# Patient Record
Sex: Male | Born: 1945 | Race: Asian | Hispanic: No | Marital: Married | Smoking: Never smoker
Health system: Southern US, Community
[De-identification: ages and names within clinical notes are randomized; demographics above are authoritative.]

## PROBLEM LIST (undated history)

## (undated) DIAGNOSIS — I2510 Atherosclerotic heart disease of native coronary artery without angina pectoris: Secondary | ICD-10-CM

## (undated) DIAGNOSIS — I219 Acute myocardial infarction, unspecified: Secondary | ICD-10-CM

## (undated) DIAGNOSIS — I4891 Unspecified atrial fibrillation: Secondary | ICD-10-CM

## (undated) DIAGNOSIS — E785 Hyperlipidemia, unspecified: Secondary | ICD-10-CM

## (undated) DIAGNOSIS — R079 Chest pain, unspecified: Secondary | ICD-10-CM

## (undated) DIAGNOSIS — F1020 Alcohol dependence, uncomplicated: Secondary | ICD-10-CM

## (undated) DIAGNOSIS — I10 Essential (primary) hypertension: Secondary | ICD-10-CM

## (undated) DIAGNOSIS — E119 Type 2 diabetes mellitus without complications: Secondary | ICD-10-CM

## (undated) HISTORY — PX: APPENDECTOMY: SHX54

---

## 2018-02-20 ENCOUNTER — Emergency Department (HOSPITAL_BASED_OUTPATIENT_CLINIC_OR_DEPARTMENT_OTHER): Payer: BLUE CROSS/BLUE SHIELD

## 2018-02-20 ENCOUNTER — Inpatient Hospital Stay (HOSPITAL_BASED_OUTPATIENT_CLINIC_OR_DEPARTMENT_OTHER)
Admission: EM | Admit: 2018-02-20 | Discharge: 2018-03-03 | DRG: 234 | Disposition: A | Discharge status: Home / Self Care | Payer: BLUE CROSS/BLUE SHIELD | Attending: Cardiothoracic Surgery | Admitting: Cardiothoracic Surgery

## 2018-02-20 ENCOUNTER — Encounter (HOSPITAL_BASED_OUTPATIENT_CLINIC_OR_DEPARTMENT_OTHER): Payer: Self-pay | Admitting: *Deleted

## 2018-02-20 ENCOUNTER — Other Ambulatory Visit: Payer: Self-pay

## 2018-02-20 DIAGNOSIS — E785 Hyperlipidemia, unspecified: Secondary | ICD-10-CM | POA: Diagnosis present

## 2018-02-20 DIAGNOSIS — E119 Type 2 diabetes mellitus without complications: Secondary | ICD-10-CM | POA: Diagnosis not present

## 2018-02-20 DIAGNOSIS — I499 Cardiac arrhythmia, unspecified: Secondary | ICD-10-CM | POA: Diagnosis not present

## 2018-02-20 DIAGNOSIS — I214 Non-ST elevation (NSTEMI) myocardial infarction: Principal | ICD-10-CM | POA: Diagnosis present

## 2018-02-20 DIAGNOSIS — I2510 Atherosclerotic heart disease of native coronary artery without angina pectoris: Secondary | ICD-10-CM | POA: Diagnosis not present

## 2018-02-20 DIAGNOSIS — I9789 Other postprocedural complications and disorders of the circulatory system, not elsewhere classified: Secondary | ICD-10-CM

## 2018-02-20 DIAGNOSIS — D62 Acute posthemorrhagic anemia: Secondary | ICD-10-CM | POA: Diagnosis not present

## 2018-02-20 DIAGNOSIS — Z23 Encounter for immunization: Secondary | ICD-10-CM | POA: Diagnosis not present

## 2018-02-20 DIAGNOSIS — Z8673 Personal history of transient ischemic attack (TIA), and cerebral infarction without residual deficits: Secondary | ICD-10-CM

## 2018-02-20 DIAGNOSIS — Z79899 Other long term (current) drug therapy: Secondary | ICD-10-CM

## 2018-02-20 DIAGNOSIS — Z951 Presence of aortocoronary bypass graft: Secondary | ICD-10-CM

## 2018-02-20 DIAGNOSIS — K5900 Constipation, unspecified: Secondary | ICD-10-CM | POA: Diagnosis not present

## 2018-02-20 DIAGNOSIS — R079 Chest pain, unspecified: Secondary | ICD-10-CM | POA: Diagnosis not present

## 2018-02-20 DIAGNOSIS — E8770 Fluid overload, unspecified: Secondary | ICD-10-CM | POA: Diagnosis not present

## 2018-02-20 DIAGNOSIS — I4891 Unspecified atrial fibrillation: Secondary | ICD-10-CM | POA: Diagnosis present

## 2018-02-20 DIAGNOSIS — Z9114 Patient's other noncompliance with medication regimen: Secondary | ICD-10-CM

## 2018-02-20 DIAGNOSIS — I252 Old myocardial infarction: Secondary | ICD-10-CM

## 2018-02-20 DIAGNOSIS — Z9119 Patient's noncompliance with other medical treatment and regimen: Secondary | ICD-10-CM

## 2018-02-20 DIAGNOSIS — R000 Tachycardia, unspecified: Secondary | ICD-10-CM | POA: Insufficient documentation

## 2018-02-20 DIAGNOSIS — Z8249 Family history of ischemic heart disease and other diseases of the circulatory system: Secondary | ICD-10-CM

## 2018-02-20 DIAGNOSIS — R072 Precordial pain: Secondary | ICD-10-CM | POA: Diagnosis not present

## 2018-02-20 DIAGNOSIS — F1020 Alcohol dependence, uncomplicated: Secondary | ICD-10-CM | POA: Diagnosis present

## 2018-02-20 DIAGNOSIS — I10 Essential (primary) hypertension: Secondary | ICD-10-CM | POA: Diagnosis present

## 2018-02-20 DIAGNOSIS — D696 Thrombocytopenia, unspecified: Secondary | ICD-10-CM | POA: Diagnosis not present

## 2018-02-20 DIAGNOSIS — Z7902 Long term (current) use of antithrombotics/antiplatelets: Secondary | ICD-10-CM

## 2018-02-20 DIAGNOSIS — E1165 Type 2 diabetes mellitus with hyperglycemia: Secondary | ICD-10-CM | POA: Diagnosis not present

## 2018-02-20 DIAGNOSIS — N179 Acute kidney failure, unspecified: Secondary | ICD-10-CM | POA: Diagnosis not present

## 2018-02-20 HISTORY — DX: Hyperlipidemia, unspecified: E78.5

## 2018-02-20 HISTORY — DX: Type 2 diabetes mellitus without complications: E11.9

## 2018-02-20 HISTORY — DX: Unspecified atrial fibrillation: I48.91

## 2018-02-20 HISTORY — DX: Atherosclerotic heart disease of native coronary artery without angina pectoris: I25.10

## 2018-02-20 HISTORY — DX: Acute myocardial infarction, unspecified: I21.9

## 2018-02-20 HISTORY — DX: Alcohol dependence, uncomplicated: F10.20

## 2018-02-20 HISTORY — DX: Essential (primary) hypertension: I10

## 2018-02-20 HISTORY — DX: Chest pain, unspecified: R07.9

## 2018-02-20 LAB — CBC WITH DIFFERENTIAL/PLATELET
Basophils Absolute: 0 10*3/uL (ref 0.0–0.1)
Basophils Relative: 0 %
EOS ABS: 0.4 10*3/uL (ref 0.0–0.7)
Eosinophils Relative: 6 %
HEMATOCRIT: 42.8 % (ref 39.0–52.0)
HEMOGLOBIN: 14.6 g/dL (ref 13.0–17.0)
LYMPHS ABS: 1.4 10*3/uL (ref 0.7–4.0)
Lymphocytes Relative: 19 %
MCH: 31.1 pg (ref 26.0–34.0)
MCHC: 34.1 g/dL (ref 30.0–36.0)
MCV: 91.3 fL (ref 78.0–100.0)
MONOS PCT: 10 %
Monocytes Absolute: 0.7 10*3/uL (ref 0.1–1.0)
NEUTROS PCT: 65 %
Neutro Abs: 4.8 10*3/uL (ref 1.7–7.7)
Platelets: 192 10*3/uL (ref 150–400)
RBC: 4.69 MIL/uL (ref 4.22–5.81)
RDW: 12.8 % (ref 11.5–15.5)
WBC: 7.4 10*3/uL (ref 4.0–10.5)

## 2018-02-20 LAB — COMPREHENSIVE METABOLIC PANEL
ALK PHOS: 69 U/L (ref 38–126)
ALT: 17 U/L (ref 0–44)
ANION GAP: 9 (ref 5–15)
AST: 22 U/L (ref 15–41)
Albumin: 4.2 g/dL (ref 3.5–5.0)
BILIRUBIN TOTAL: 1 mg/dL (ref 0.3–1.2)
BUN: 13 mg/dL (ref 8–23)
CALCIUM: 9.1 mg/dL (ref 8.9–10.3)
CO2: 28 mmol/L (ref 22–32)
CREATININE: 0.83 mg/dL (ref 0.61–1.24)
Chloride: 101 mmol/L (ref 98–111)
Glucose, Bld: 153 mg/dL — ABNORMAL HIGH (ref 70–99)
Potassium: 3.6 mmol/L (ref 3.5–5.1)
Sodium: 138 mmol/L (ref 135–145)
TOTAL PROTEIN: 7.1 g/dL (ref 6.5–8.1)

## 2018-02-20 LAB — LIPID PANEL
CHOLESTEROL: 201 mg/dL — AB (ref 0–200)
HDL: 56 mg/dL (ref >40)
LDL Cholesterol: 118 mg/dL — ABNORMAL HIGH (ref 0–99)
Total CHOL/HDL Ratio: 3.6 RATIO
Triglycerides: 135 mg/dL (ref <150)
VLDL: 27 mg/dL (ref 0–40)

## 2018-02-20 LAB — HEMOGLOBIN A1C
HEMOGLOBIN A1C: 6 % — AB (ref 4.8–5.6)
MEAN PLASMA GLUCOSE: 125.5 mg/dL

## 2018-02-20 LAB — GLUCOSE, CAPILLARY
Glucose-Capillary: 140 mg/dL — ABNORMAL HIGH (ref 70–99)
Glucose-Capillary: 191 mg/dL — ABNORMAL HIGH (ref 70–99)
Glucose-Capillary: 188 mg/dL — ABNORMAL HIGH (ref 70–99)

## 2018-02-20 LAB — TROPONIN I
TROPONIN I: 1.94 ng/mL — AB (ref <0.03)
Troponin I: <0.03 ng/mL (ref <0.03)
Troponin I: 0.21 ng/mL (ref <0.03)

## 2018-02-20 LAB — TSH: TSH: 0.964 u[IU]/mL (ref 0.350–4.500)

## 2018-02-20 MED ORDER — HEPARIN BOLUS VIA INFUSION
4000.0000 [IU] | Freq: Once | INTRAVENOUS | Status: AC
  Administered 2018-02-20: 4000 [IU] via INTRAVENOUS
  Filled 2018-02-20: qty 4000

## 2018-02-20 MED ORDER — MORPHINE SULFATE (PF) 2 MG/ML IV SOLN
2.0000 mg | INTRAVENOUS | Status: DC | PRN
  Administered 2018-02-24: 2 mg via INTRAVENOUS
  Filled 2018-02-20: qty 1

## 2018-02-20 MED ORDER — GI COCKTAIL ~~LOC~~: 30.0000 mL | Freq: Four times a day (QID) | ORAL | Status: DC | PRN

## 2018-02-20 MED ORDER — ASPIRIN EC 325 MG PO TBEC
325.0000 mg | DELAYED_RELEASE_TABLET | Freq: Every day | ORAL | Status: DC
  Administered 2018-02-21 – 2018-02-22 (×2): 325 mg via ORAL
  Filled 2018-02-20 (×2): qty 1

## 2018-02-20 MED ORDER — METOPROLOL TARTRATE 12.5 MG HALF TABLET
12.5000 mg | ORAL_TABLET | Freq: Two times a day (BID) | ORAL | Status: DC
  Administered 2018-02-20 – 2018-02-21 (×3): 12.5 mg via ORAL
  Filled 2018-02-20 (×3): qty 1

## 2018-02-20 MED ORDER — CLOPIDOGREL BISULFATE 75 MG PO TABS
75.0000 mg | ORAL_TABLET | Freq: Every day | ORAL | Status: DC
  Administered 2018-02-20 – 2018-02-23 (×4): 75 mg via ORAL
  Filled 2018-02-20 (×5): qty 1

## 2018-02-20 MED ORDER — METOPROLOL TARTRATE 5 MG/5ML IV SOLN
5.0000 mg | Freq: Once | INTRAVENOUS | Status: AC
  Administered 2018-02-20: 5 mg via INTRAVENOUS
  Filled 2018-02-20: qty 5

## 2018-02-20 MED ORDER — METOPROLOL TARTRATE 5 MG/5ML IV SOLN
2.5000 mg | Freq: Once | INTRAVENOUS | Status: AC
  Administered 2018-02-20: 2.5 mg via INTRAVENOUS
  Filled 2018-02-20: qty 5

## 2018-02-20 MED ORDER — PANTOPRAZOLE SODIUM 40 MG PO TBEC: 40.0000 mg | DELAYED_RELEASE_TABLET | Freq: Every day | ORAL | Status: DC

## 2018-02-20 MED ORDER — ENOXAPARIN SODIUM 40 MG/0.4ML ~~LOC~~ SOLN
40.0000 mg | SUBCUTANEOUS | Status: DC
  Administered 2018-02-20: 40 mg via SUBCUTANEOUS
  Filled 2018-02-20 (×2): qty 0.4

## 2018-02-20 MED ORDER — ONDANSETRON HCL 4 MG/2ML IJ SOLN: 4.0000 mg | Freq: Four times a day (QID) | INTRAMUSCULAR | Status: DC | PRN

## 2018-02-20 MED ORDER — VITAMIN B-1 100 MG PO TABS
100.0000 mg | ORAL_TABLET | Freq: Every day | ORAL | Status: DC
  Administered 2018-02-20 – 2018-03-03 (×10): 100 mg via ORAL
  Filled 2018-02-20 (×11): qty 1

## 2018-02-20 MED ORDER — NITROGLYCERIN 0.4 MG SL SUBL
0.4000 mg | SUBLINGUAL_TABLET | SUBLINGUAL | Status: DC | PRN
  Administered 2018-02-20 – 2018-02-24 (×3): 0.4 mg via SUBLINGUAL
  Filled 2018-02-20 (×3): qty 1

## 2018-02-20 MED ORDER — AMLODIPINE BESYLATE 5 MG PO TABS
5.0000 mg | ORAL_TABLET | Freq: Every day | ORAL | Status: DC
  Administered 2018-02-20 – 2018-02-25 (×6): 5 mg via ORAL
  Filled 2018-02-20 (×6): qty 1

## 2018-02-20 MED ORDER — ADULT MULTIVITAMIN W/MINERALS CH
1.0000 | ORAL_TABLET | Freq: Every day | ORAL | Status: DC
  Administered 2018-02-20 – 2018-03-03 (×11): 1 via ORAL
  Filled 2018-02-20 (×11): qty 1

## 2018-02-20 MED ORDER — ATORVASTATIN CALCIUM 20 MG PO TABS
20.0000 mg | ORAL_TABLET | Freq: Every day | ORAL | Status: DC
  Administered 2018-02-20: 20 mg via ORAL
  Filled 2018-02-20: qty 1

## 2018-02-20 MED ORDER — ACETAMINOPHEN 325 MG PO TABS
650.0000 mg | ORAL_TABLET | ORAL | Status: DC | PRN
  Administered 2018-02-22 – 2018-02-23 (×2): 650 mg via ORAL
  Filled 2018-02-20 (×2): qty 2

## 2018-02-20 MED ORDER — FOLIC ACID 1 MG PO TABS
1.0000 mg | ORAL_TABLET | Freq: Every day | ORAL | Status: DC
  Administered 2018-02-20 – 2018-03-03 (×11): 1 mg via ORAL
  Filled 2018-02-20 (×11): qty 1

## 2018-02-20 MED ORDER — ASPIRIN 81 MG PO CHEW
324.0000 mg | CHEWABLE_TABLET | Freq: Once | ORAL | Status: AC
  Administered 2018-02-20: 324 mg via ORAL
  Filled 2018-02-20: qty 4

## 2018-02-20 MED ORDER — THIAMINE HCL 100 MG/ML IJ SOLN
100.0000 mg | Freq: Every day | INTRAMUSCULAR | Status: DC
  Administered 2018-02-27: 100 mg via INTRAVENOUS
  Filled 2018-02-20: qty 2

## 2018-02-20 MED ORDER — INSULIN ASPART 100 UNIT/ML ~~LOC~~ SOLN
0.0000 [IU] | Freq: Three times a day (TID) | SUBCUTANEOUS | Status: DC
  Administered 2018-02-20 – 2018-02-21 (×2): 2 [IU] via SUBCUTANEOUS
  Administered 2018-02-21: 1 [IU] via SUBCUTANEOUS
  Administered 2018-02-21: 2 [IU] via SUBCUTANEOUS
  Administered 2018-02-22: 3 [IU] via SUBCUTANEOUS
  Administered 2018-02-22 – 2018-02-23 (×4): 1 [IU] via SUBCUTANEOUS
  Administered 2018-02-24: 3 [IU] via SUBCUTANEOUS
  Administered 2018-02-24: 1 [IU] via SUBCUTANEOUS
  Administered 2018-02-25: 2 [IU] via SUBCUTANEOUS
  Administered 2018-02-25: 1 [IU] via SUBCUTANEOUS

## 2018-02-20 MED ORDER — LORAZEPAM 2 MG/ML IJ SOLN: 1.0000 mg | Freq: Four times a day (QID) | INTRAMUSCULAR | Status: AC | PRN

## 2018-02-20 MED ORDER — INSULIN ASPART 100 UNIT/ML ~~LOC~~ SOLN: 0.0000 [IU] | Freq: Every day | SUBCUTANEOUS | Status: DC

## 2018-02-20 MED ORDER — HEPARIN (PORCINE) IN NACL 100-0.45 UNIT/ML-% IJ SOLN
1150.0000 [IU]/h | INTRAMUSCULAR | Status: DC
  Administered 2018-02-20: 1000 [IU]/h via INTRAVENOUS
  Administered 2018-02-21: 900 [IU]/h via INTRAVENOUS
  Administered 2018-02-22: 1150 [IU]/h via INTRAVENOUS
  Filled 2018-02-20 (×3): qty 250

## 2018-02-20 MED ORDER — LORAZEPAM 1 MG PO TABS: 1.0000 mg | ORAL_TABLET | Freq: Four times a day (QID) | ORAL | Status: AC | PRN

## 2018-02-20 NOTE — ED Notes (Signed)
ED Provider at bedside. 

## 2018-02-20 NOTE — ED Provider Notes (Signed)
Patient care assumed at 0700. Patient with history of coronary artery disease, hypertension, hyperlipidemia, alcohol abuse here for evaluation of episode of left sided chest pain that is resolved after nitroglycerin administration. He is noted to be in atrial fibrillation versus atrial tachycardia, no prior history of arrhythmia. Unclear how long he has been in this rhythm. His heart rates ranged from 90s to 110s during the ED stay. Metoprolol 2.5 was administered and following this is heart rate was the 70s. Recommend admission of for cardiac evaluation and further workup of possible atrial arrhythmia. Patient and family updated findings of studies recommendation for admission and they are in agreement with plan. Plan to [facility] for further evaluation. Discussed with Dr. Criselda PeachesMullen with the hospitalist service who accepts the patient and transfer.   Tilden Fossaees, Jahliyah Trice, MD 02/20/18 [phone]

## 2018-02-20 NOTE — Progress Notes (Signed)
Pt is having Cp of 3/10, Nitroglycerin given, MD notified. Will continue to monitor.

## 2018-02-20 NOTE — H&P (Signed)
History and Physical    Shane Yates ZOX:[id number] DOB: [birth year] DOA: 02/20/2018  PCP: Patient, No Pcp Per Patient coming from: home  Chief Complaint: chest pain sob  HPI: Shane Yates is a 72 y.o. male with medical history significant cad, afib on plavix, htn, diabetes, non-compliance etoh use is admitted to 3 east room 26 from [facility] [place] for chest pain rule out. [facility] are asked to admit  Information is provided by the patient through his son and interpreter as patient does not speak [place]. Son reports patient is usual state of health until early this morning he developed left anterior chest pain. He described the pain as sharp radiating to his left shoulder down his left arm. He described a "tingling" in his left hand. He denies any worsening short of breath but son reports his baseline is short of breath particularly when he sleeps. He says that he has observed patient during sleep "gasp" for air intermittently. There is been no headache dizziness syncope or near-syncope. There is been no palpitations lower extremity edema cough fever chills. No abdominal pain nausea vomiting diarrhea constipation melena bright red blood per rectum. No dysuria hematuria frequency or urgency. Patient does complain of chronic right knee pain from an old injury that has been bothering him since his long flight from [place] to here 4 months ago. Son also reports that 5 years ago patient had a similar pain he was told at that time he had heart disease and a stent was recommended but the patient declined. Son also reports that patient is basically "not taking any of the medicines the doctor's recommended". He does say that the patient consumes 150 mils of "hard liquor" daily. son reports that pain resolved shortly after nitroglycerin administered.   ED Course: in the emergency department he is afebrile hemodynamically stable and noted to be in atrial fibrillation versus atrial tachycardia. He  received 2.5 mg of metoprolol and at the time of admission heart rate is in the 80s. Repeat EKG pending.  Review of Systems: As per HPI otherwise all other systems reviewed and are negative.   Ambulatory Status: ambulates independently is independent with ADLs  Past Medical History:  Diagnosis Date  . Atrial fibrillation with RVR (HCC)   . Chest pain   . Coronary artery disease   . Diabetes mellitus without complication (HCC)   . EtOH dependence (HCC)   . Hyperlipidemia   . Hypertension   . MI (myocardial infarction) [facility])     Past Surgical History:  Procedure Laterality Date  . APPENDECTOMY      Social History   Socioeconomic History  . Marital status: Married    Spouse name: Not on file  . Number of children: Not on file  . Years of education: Not on file  . Highest education level: Not on file  Occupational History  . Not on file  Social Needs  . Financial resource strain: Not on file  . Food insecurity:    Worry: Not on file    Inability: Not on file  . Transportation needs:    Medical: Not on file    Non-medical: Not on file  Tobacco Use  . Smoking status: Never Smoker  . Smokeless tobacco: Never Used  Substance and Sexual Activity  . Alcohol use: Yes    Comment: Per son drinks nightly about of liquor, last drink last night 02/19/18  . Drug use: Never  . Sexual activity: Not on file  Lifestyle  .  Physical activity:    Days per week: Not on file    Minutes per session: Not on file  . Stress: Not on file  Relationships  . Social connections:    Talks on phone: Not on file    Gets together: Not on file    Attends religious service: Not on file    Active member of club or organization: Not on file    Attends meetings of clubs or organizations: Not on file    Relationship status: Not on file  . Intimate partner violence:    Fear of current or ex partner: Not on file    Emotionally abused: Not on file    Physically abused: Not on file    Forced  sexual activity: Not on file  Other Topics Concern  . Not on file  Social History Narrative  . Not on file    No Known Allergies  Family History  Problem Relation Age of Onset  . Hypertension Father     Prior to Admission medications   Medication Sig Start Date End Date Taking? Authorizing Provider  atorvastatin (LIPITOR) 20 MG tablet Take 20 mg by mouth every other day.   Yes [provider]  clopidogrel (PLAVIX) 75 MG tablet Take 75 mg by mouth every other day.   Yes [provider]  pantoprazole (PROTONIX) 40 MG tablet Take 40 mg by mouth every other day.   Yes [provider]  PRESCRIPTION MEDICATION Metoprolol-Amlodipine 50/5mg  1 tab every other day   Yes [provider]  PRESCRIPTION MEDICATION    Yes [provider]  sildenafil (VIAGRA) 100 MG tablet Take 100 mg by mouth as needed for erectile dysfunction.   Yes [provider]  UNABLE TO FIND Volix 0.3mg  every other day   Yes [provider]    Physical Exam: Vitals:   02/20/18 0900 02/20/18 0930 02/20/18 1048 02/20/18 1050  BP: 130/76 135/82 (!) 168/81   Pulse: 73 80 84   Resp: 15 16 18    Temp:   98.4 F (36.9 C)   TempSrc:   Oral   SpO2: 95% 98% 99%   Weight:    81.5 kg (179 lb 9.6 oz)  Height:    5\' 10"  (1.778 m)     General:  Appears calm and comfortable lying in bed with no acute distress Eyes:  PERRL, EOMI, normal lids, iris ENT:  grossly normal hearing, lips & tongue, mucous membranes of his mouth are moist and pink Neck:  no LAD, masses or thyromegaly Cardiovascular:  RRR, heart sounds are distant but I hear no m/r/g. No LE edema.  Respiratory:  Normal effort breath sounds are distant but clear here no wheezing no rhonchi no crackles Abdomen:  soft, ntnd, positive bowel sounds throughout no guarding or rebounding Skin:  no rash or induration seen on limited exam Musculoskeletal:  grossly normal tone BUE/BLE, good ROM, no bony  abnormality Psychiatric:  grossly normal mood and affect, speech fluent and appropriate, AOx3 Neurologic:  CN 2-12 grossly intact, moves all extremities in coordinated fashion, sensation intact follows commands bilateral grip 5 out of 5 lower extremity strength 5 out of 5 bilaterally  Labs on Admission: I have personally reviewed following labs and imaging studies  CBC: Recent Labs  Lab 02/20/18 0628  WBC 7.4  NEUTROABS 4.8  HGB 14.6  HCT 42.8  MCV 91.3  PLT 192   Basic Metabolic Panel: Recent Labs  Lab 02/20/18 0628  NA 138  K  3.6  CL 101  CO2 28  GLUCOSE 153*  BUN 13  CREATININE 0.83  CALCIUM 9.1   GFR: Estimated Creatinine Clearance: 84.3 mL/min (by C-G formula based on SCr of 0.83 mg/dL). Liver Function Tests: Recent Labs  Lab 02/20/18 0628  AST 22  ALT 17  ALKPHOS 69  BILITOT 1.0  PROT 7.1  ALBUMIN 4.2   No results for input(s): LIPASE, AMYLASE in the last 168 hours. No results for input(s): AMMONIA in the last 168 hours. Coagulation Profile: No results for input(s): INR, PROTIME in the last 168 hours. Cardiac Enzymes: Recent Labs  Lab 02/20/18 0628  TROPONINI <0.03   BNP (last 3 results) No results for input(s): PROBNP in the last 8760 hours. HbA1C: No results for input(s): HGBA1C in the last 72 hours. CBG: No results for input(s): GLUCAP in the last 168 hours. Lipid Profile: No results for input(s): CHOL, HDL, LDLCALC, TRIG, CHOLHDL, LDLDIRECT in the last 72 hours. Thyroid Function Tests: No results for input(s): TSH, T4TOTAL, FREET4, T3FREE, THYROIDAB in the last 72 hours. Anemia Panel: No results for input(s): VITAMINB12, FOLATE, FERRITIN, TIBC, IRON, RETICCTPCT in the last 72 hours. Urine analysis: No results found for: COLORURINE, APPEARANCEUR, LABSPEC, PHURINE, GLUCOSEU, HGBUR, BILIRUBINUR, KETONESUR, PROTEINUR, UROBILINOGEN, NITRITE, LEUKOCYTESUR  Creatinine Clearance: Estimated Creatinine Clearance: 84.3 mL/min (by C-G formula based  on SCr of 0.83 mg/dL).  Sepsis Labs: @LABRCNTIP (procalcitonin:4,lacticidven:4) )No results found for this or any previous visit (from the past 240 hour(s)).   Radiological Exams on Admission: Dg Chest 2 View  Result Date: 02/20/2018 CLINICAL DATA:  Chest pain for 2 hours, pain down RIGHT arm. EXAM: CHEST - 2 VIEW COMPARISON:  None. FINDINGS: The heart size and mediastinal contours are within normal limits. Both lungs are clear. The visualized skeletal structures are unremarkable. IMPRESSION: No active cardiopulmonary disease. Electronically Signed   By: Bary RichardStan  Maynard M.D.   On: 02/20/2018 07:26    EKG: Independently reviewed. Sinus tachycardia with irregular rate Nonspecific repol abnormality, lateral leads No previous tracing  Assessment/Plan Principal Problem:   Chest pain Active Problems:   Atrial fibrillation with RVR (HCC)   Coronary artery disease   Diabetes mellitus without complication (HCC)   Hyperlipidemia   Hypertension   EtOH dependence (HCC)   #1. Chest pain. Some typical and atypical features. History of CAD. Refused stents in the past.heart score 5. Concern for ACS. Initial troponin 0.03. Chest x-ray without acute cardiopulmonary process. Initial EKG sinus tachycardia with irregular rate nonspecific repoll abnormality. Home medications include Lipitor metoprolol. However patient is noncompliant with medications Recent was given nitroglycerin and has been pain-free ever since. -Admit to telemetry -Cycle troponin -Serial EKG -Obtain a lipid panel -continue Lipitor and metoprolol -Continue Plavix and aspirin -GI cocktail -If he rules out may be candidate for outpatient follow-up with cardiology  #2. Atrial fibrillation with RVR. home medications include Plavix and metoprolol. Son reports patient with a history of same. He also reports noncompliance with his medications. EKG with sinus tach irregular. He was given low-dose metoprolol intravenously in the emergency  department heart rate controlled ever since. chadvasc score 4. -Repeat EKG -resume home medications particularly metoprolol and Plavix -May need new prescriptions at discharge as patient's noncompliant not sure fever and brought his medications with him from home -obtain TSH  4. Hypertension.only fair control in the emergency department. Home medications include metoprolol amlodipine combination to be taken every other day. Patient noncompliant with medications. He was given low-dose metoprolol IV at [facility]. -  will start low-dose metoprolol 12.5 mg twice a day -We'll start amlodipine 5 mg daily -Monitor blood pressure closely  #5. Diabetes. No medications listed on his home med list. Serum glucose 153. -Obtain hemoglobin A1c -Sliding scale insulin for optimal control -Carb modified diet  #6. CAD/hyperlipidemia. See #1. -Home medications as noted above  #7. EtOH use. Son reports patient drinks "hard liquor" every day. Denies patient ever going through withdrawals but states "he doesn't stop drinking long enough to do so. -ciwa protocol  DVT prophylaxis:  plavix/asa Code Status: full  Family Communication: son and wife at bedside  Disposition Plan: home hopefully 24 hours  Consults called: none  Admission status: obs    Gwenyth Bender NP [facility]  If 7PM-7AM, please contact night-coverage [email or web address] Password [facility]  02/20/2018, 11:32 AM

## 2018-02-20 NOTE — ED Notes (Signed)
EDP at BS 

## 2018-02-20 NOTE — Progress Notes (Signed)
Lab called for a troponin of 0.21 NP Clydie BraunKaren notified. Will continue to monitor.

## 2018-02-20 NOTE — Consult Note (Signed)
Cardiology Consultation:   Patient ID: Shane Yates; [id number]; June 10, 1946   Admit date: 02/20/2018 Date of Consult: 02/20/2018  Primary Care Provider: Patient, No Pcp Per Primary Cardiologist: none locally   Patient Profile:   Shane Yates is a 72 y.o. male with a hx of CAD, HTN, HLD, DM, and alcohol abuse who is being seen today for the evaluation of chest pain and elevated troponin at the request of [facility].  History of Present Illness:   Shane Yates is a 72 y.o. male with a hx of CAD, HTN, HLD, DM, and alcohol abuse who is being seen today for the evaluation of chest pain and elevated troponin.  The patient is from [place] and speaks [place]. He reportedly has a history of CAD that has been treated medically. He is on ASA and clopidogrel as home medications.    He presented to the ED with today chest pain.  He described sharp pain in his left chest with radiation down his left arm. He also reported some associated dyspnea and orthopnea. He denied palpitations or LE edema or other symptoms. In the ED, ECG showed irregular tachycardia. Troponin was initially negative. He was admitted to the hospitalist team. Since admission, his troponin has risen to 1.94. He had another episode of chest pain this evening that improved with NTG. Given concern for ACS, cardiology was consulted.   History was gathered via interpreter tablet. However, the patient is a difficult historian and did not answer many questions directly. He reported that he has had some heaviness in his chest that occurs with activity such as walking and improves with rest. He denies all other symptoms. We discussed the option of cardiac catheterization, and he expressed interest in "only medicines."  Past Medical History:  Diagnosis Date  . Atrial fibrillation with RVR (HCC)   . Chest pain   . Coronary artery disease   . Diabetes mellitus without complication (HCC)   . EtOH dependence (HCC)   . Hyperlipidemia   .  Hypertension   . MI (myocardial infarction) [facility])     Past Surgical History:  Procedure Laterality Date  . APPENDECTOMY       Home Medications:  Prior to Admission medications   Medication Sig Start Date End Date Taking? Authorizing Provider  B Complex-C (B-COMPLEX WITH VITAMIN C) tablet Take 1 tablet by mouth every 3 (three) days.   Yes [provider]  PRESCRIPTION MEDICATION Take 1 tablet by mouth every 3 (three) days. Amlong MT (metoprolol succinate 47.5 mg & metoprolol tartrate 50 mg & amlodipine 5 mg) - from [place]   Yes [provider]  PRESCRIPTION MEDICATION Take 1 tablet by mouth every other day. Diapride (metformin er 500 & glimepiride 1mg ) - from [place]   Yes [provider]  PRESCRIPTION MEDICATION Take 0.3 mg by mouth every other day. Volix (Voglibose) 0.3 mg  - from [place]   Yes [provider]  PRESCRIPTION MEDICATION Take 0.25 mg by mouth daily as needed (sleep). Petril-MD (clonazepam 0.25 mg)  - from [place]   Yes [provider]  PRESCRIPTION MEDICATION Take 20 mg by mouth every 3 (three) days. Aztor 20 mg (atorvastatin) - from [place]   Yes [provider]  PRESCRIPTION MEDICATION Place 1 drop into the left eye 2 (two) times daily. Locula (sulphacetamide sodium 20%) - from [place]   Yes [provider]  PRESCRIPTION MEDICATION Take 40 mg by mouth every 3 (three) days. Pantosec (pantoprazole 40 mg) - from [place]  Yes [provider]  PRESCRIPTION MEDICATION Take 75 mg by mouth every 3 (three) days. Plagril Gold (clopidogrel 75 mg) - from [place]   Yes [provider]  PRESCRIPTION MEDICATION Take 2.6 mg by mouth every 3 (three) days. Nitrocontine (glyceryl trinitrate 2.6 mg) - from [place]   Yes [provider]  PRESCRIPTION MEDICATION Take 100 mg by mouth daily as needed (erectile dysfunction). Vigore 100 (sildenafil citrate 100 mg) - from [place]   Yes [provider]  PRESCRIPTION  MEDICATION Take 1 tablet by mouth daily as needed (pain). Disprin (acetylsalicylic acid effervescent) - from [place]   Yes [provider]    Inpatient Medications: Scheduled Meds: . amLODipine  5 mg Oral Daily  . [START ON 02/21/2018] aspirin EC  325 mg Oral Daily  . atorvastatin  20 mg Oral q1800  . clopidogrel  75 mg Oral Daily  . folic acid  1 mg Oral Daily  . insulin aspart  0-5 Units Subcutaneous QHS  . insulin aspart  0-9 Units Subcutaneous TID WC  . metoprolol tartrate  12.5 mg Oral BID  . multivitamin with minerals  1 tablet Oral Daily  . thiamine  100 mg Oral Daily   Or  . thiamine  100 mg Intravenous Daily   Continuous Infusions: . heparin 1,000 Units/hr (02/20/18 2130)   PRN Meds: acetaminophen, gi cocktail, LORazepam **OR** LORazepam, morphine injection, nitroGLYCERIN, ondansetron (ZOFRAN) IV  Allergies:   No Known Allergies  Social History:   Social History   Socioeconomic History  . Marital status: Married    Spouse name: Not on file  . Number of children: Not on file  . Years of education: Not on file  . Highest education level: Not on file  Occupational History  . Not on file  Social Needs  . Financial resource strain: Not on file  . Food insecurity:    Worry: Not on file    Inability: Not on file  . Transportation needs:    Medical: Not on file    Non-medical: Not on file  Tobacco Use  . Smoking status: Never Smoker  . Smokeless tobacco: Never Used  Substance and Sexual Activity  . Alcohol use: Yes    Comment: Per son drinks nightly about 100ml of liquor, last drink last night 02/19/18  . Drug use: Never  . Sexual activity: Not on file  Lifestyle  . Physical activity:    Days per week: Not on file    Minutes per session: Not on file  . Stress: Not on file  Relationships  . Social connections:    Talks on phone: Not on file    Gets together: Not on file    Attends religious service: Not on file    Active member of club or  organization: Not on file    Attends meetings of clubs or organizations: Not on file    Relationship status: Not on file  . Intimate partner violence:    Fear of current or ex partner: Not on file    Emotionally abused: Not on file    Physically abused: Not on file    Forced sexual activity: Not on file  Other Topics Concern  . Not on file  Social History Narrative  . Not on file    Family History:   Family History  Problem Relation Age of Onset  . Hypertension Father      ROS:  Please see the history of present illness.  All other ROS  reviewed and negative.     Physical Exam/Data:   Vitals:   02/20/18 1310 02/20/18 1634 02/20/18 1905 02/20/18 2128  BP: (!) 174/83 (!) 143/73 (!) 156/76 127/80  Pulse: 89 61 63 64  Resp: 18 18  17   Temp: 97.9 F (36.6 C) 98.2 F (36.8 C)  98.2 F (36.8 C)  TempSrc: Oral Oral  Oral  SpO2: 99% 98%  96%  Weight:      Height:        Intake/Output Summary (Last 24 hours) at 02/20/2018 2158 Last data filed at 02/20/2018 1700 Gross per 24 hour  Intake 480 ml  Output -  Net 480 ml   Filed Weights   02/20/18 0639 02/20/18 1050  Weight: 82.7 kg (182 lb 5.1 oz) 81.5 kg (179 lb 9.6 oz)   Body mass index is 25.77 kg/m.  General:  Well nourished, well developed, in no acute distress  HEENT: normal Lymph: no adenopathy Neck: no apparent JVD Cardiac:  Tachycardic and mostly regular without significant mumur Lungs:  clear to auscultation bilaterally, no wheezing, rhonchi or rales  Abd: soft, nontender, no hepatomegaly  Ext: no edema Musculoskeletal:  No deformities, BUE and BLE strength normal and equal Skin: warm and dry  Neuro:  No focal abnormalities noted Psych:  Normal affect   EKG:  The EKG was personally reviewed and demonstrates:  Irregular tachycardia with varying P-wave morphology (possibly MAT) and nonspecific ST changes Telemetry:  Telemetry was personally reviewed and demonstrates:  Tachycardia with varying P wave  morphology  Relevant CV Studies: None in system   Laboratory Data:  Chemistry Recent Labs  Lab 02/20/18 0628  NA 138  K 3.6  CL 101  CO2 28  GLUCOSE 153*  BUN 13  CREATININE 0.83  CALCIUM 9.1  GFRNONAA >60  GFRAA >60  ANIONGAP 9    Recent Labs  Lab 02/20/18 0628  PROT 7.1  ALBUMIN 4.2  AST 22  ALT 17  ALKPHOS 69  BILITOT 1.0   Hematology Recent Labs  Lab 02/20/18 0628  WBC 7.4  RBC 4.69  HGB 14.6  HCT 42.8  MCV 91.3  MCH 31.1  MCHC 34.1  RDW 12.8  PLT 192   Cardiac Enzymes Recent Labs  Lab 02/20/18 0628 02/20/18 1046 02/20/18 1615  TROPONINI <0.03 0.21* 1.94*   No results for input(s): TROPIPOC in the last 168 hours.  BNPNo results for input(s): BNP, PROBNP in the last 168 hours.  DDimer No results for input(s): DDIMER in the last 168 hours.  Radiology/Studies:  Dg Chest 2 View  Result Date: 02/20/2018 CLINICAL DATA:  Chest pain for 2 hours, pain down RIGHT arm. EXAM: CHEST - 2 VIEW COMPARISON:  None. FINDINGS: The heart size and mediastinal contours are within normal limits. Both lungs are clear. The visualized skeletal structures are unremarkable. IMPRESSION: No active cardiopulmonary disease. Electronically Signed   By: Bary Richard M.D.   On: 02/20/2018 07:26    Assessment and Plan:   NSTEMI History of CAD The patient has a history of CAD that has been treated medically. He presents with recurrent chest discomfort that occurs with exertion and now with rest. He has nonspecific changes on his ECG, and troponin is rising, consistent with probable NSTEMI. He is chest pain free at the time of my evaluation. I discussed different treatment options with the patient, and he seems disinclined to pursue invasive evaluation and wants "only medicines." Recommend ongoing medical treatment at this time with continued discussion about treatment  options when more family is present. -Continue to trend troponin  -Continue to monitor symptoms. Repeat NTG for  recurrent CP -Continue heparin gtt -Continue home ASA, clopidogrel -Continue atorvastatin  -Continue metoprolol  -Echocardiogram ordered -Lipid panel, HgA1c ordered by primary team -Will need ongoing discussion regarding treatment options.    Irregular heart rhythm  The patient was noted to have irregular tachycardia on admission and was described to have Afib. On my evaluation of his ECGs and telemetry, he appears to have an atrial complex before each QRS, although morphology is variable. Therefore query MAT or possibly blocked PACs.  -Recommend ongoing monitoring on telemetry  -Metoprolol can likely be up-titrated for better control  HTN BP elevated on admission but improved now -Continue metoprolol, amlodipine    For questions or updates, please contact [facility] Please consult [email or web address] for contact info under Cardiology/STEMI.   Signed, Ernest Mallick, MD  02/20/2018 9:58 PM

## 2018-02-20 NOTE — ED Provider Notes (Signed)
[facility] EMERGENCY DEPARTMENT Provider Note   CSN: [id number] Arrival date & time: 02/20/18  0557     History   Chief Complaint Chief Complaint  Patient presents with  . Chest Pain    HPI Shane Yates is a 72 y.o. male.  Patient presents to the emergency department for evaluation of chest pain.  Patient reports that the pain began at approximately 5 AM.  Pain radiates to the left arm and he has some tingling in the hand.  He is not short of breath.  No nausea, diaphoresis.  Patient reports that he had similar pain years ago in [place].  He was told that it was had his heart at that time and it was recommended that he have a stent placed, but he declined.  He has been medically managed since then, does not regularly have similar pain.     Past Medical History:  Diagnosis Date  . Coronary artery disease   . Diabetes mellitus without complication (HCC)   . Hyperlipidemia   . Hypertension   . MI (myocardial infarction) (HCC)     There are no active problems to display for this patient.   Past Surgical History:  Procedure Laterality Date  . APPENDECTOMY          Home Medications    Prior to Admission medications   Not on File    Family History History reviewed. No pertinent family history.  Social History Social History   Tobacco Use  . Smoking status: Never Smoker  . Smokeless tobacco: Never Used  Substance Use Topics  . Alcohol use: Yes  . Drug use: Never     Allergies   Patient has no known allergies.   Review of Systems Review of Systems  Cardiovascular: Positive for chest pain.  All other systems reviewed and are negative.    Physical Exam Updated Vital Signs BP (!) 158/98   Pulse (!) 117   Temp 98.2 F (36.8 C) (Oral)   Resp 17   Wt 82.7 kg (182 lb 5.1 oz)   SpO2 94%   Physical Exam  Constitutional: He is oriented to person, place, and time. He appears well-developed and well-nourished. No distress.  HENT:  Head:  Normocephalic and atraumatic.  Right Ear: Hearing normal.  Left Ear: Hearing normal.  Nose: Nose normal.  Mouth/Throat: Oropharynx is clear and moist and mucous membranes are normal.  Eyes: Pupils are equal, round, and reactive to light. Conjunctivae and EOM are normal.  Neck: Normal range of motion. Neck supple.  Cardiovascular: S1 normal and S2 normal. An irregular rhythm present. Exam reveals no gallop and no friction rub.  No murmur heard. Pulmonary/Chest: Effort normal and breath sounds normal. No respiratory distress. He exhibits no tenderness.  Abdominal: Soft. Normal appearance and bowel sounds are normal. There is no hepatosplenomegaly. There is no tenderness. There is no rebound, no guarding, no tenderness at McBurney's point and negative Murphy's sign. No hernia.  Musculoskeletal: Normal range of motion.  Neurological: He is alert and oriented to person, place, and time. He has normal strength. No cranial nerve deficit or sensory deficit. Coordination normal. GCS eye subscore is 4. GCS verbal subscore is 5. GCS motor subscore is 6.  Skin: Skin is warm, dry and intact. No rash noted. No cyanosis.  Psychiatric: He has a normal mood and affect. His speech is normal and behavior is normal. Thought content normal.  Nursing note and vitals reviewed.    ED Treatments / Results  Labs (all labs ordered are listed, but only abnormal results are displayed) Labs Reviewed  CBC WITH DIFFERENTIAL/PLATELET  TROPONIN I  COMPREHENSIVE METABOLIC PANEL    EKG EKG Interpretation  Date/Time:  Saturday February 20 2018 06:11:20 EDT Ventricular Rate:  103 PR Interval:    QRS Duration: 78 QT Interval:  337 QTC Calculation: 442 R Axis:   48 Text Interpretation:  Sinus tachycardia with irregular rate Nonspecific repol abnormality, lateral leads No previous tracing Confirmed by Gilda Crease [phone]) on 02/20/2018 6:14:20 AM   Radiology No results found.  Procedures Procedures  (including critical care time)  Medications Ordered in ED Medications  aspirin chewable tablet 324 mg (has no administration in time range)  nitroGLYCERIN (NITROSTAT) SL tablet 0.4 mg (has no administration in time range)     Initial Impression / Assessment and Plan / ED Course  I have reviewed the triage vital signs and the nursing notes.  Pertinent labs & imaging results that were available during my care of the patient were reviewed by me and considered in my medical decision making (see chart for details).     Patient presents with complaints of left-sided chest pain radiating to the left arm.  It sounds like he has been diagnosed with coronary artery disease previously in [place] but has not had ongoing care.  EKG shows tachycardia with atrial ectopy, nonspecific ST depressions.  Aspirin nitroglycerin ordered.  Cardiac work-up in progress. Will sign out to oncoming ER Physician to monitor and disposition.  Final Clinical Impressions(s) / ED Diagnoses   Final diagnoses:  Chest pain, unspecified type    ED Discharge Orders    None       Gilda Crease, MD 02/20/18 [phone]

## 2018-02-20 NOTE — ED Notes (Signed)
Xray here for pt, pt up to b/r prior to xray.

## 2018-02-20 NOTE — ED Triage Notes (Signed)
Here by POV with family from home for CP. Pt does not speak [place]. Family (son) translating. Wife present. C/o sharp 6/10 CP onset ~ 30 minutes PTA (0545), also HA. (Denies: back or abd pain, fever, cough, sob, NVD, dizziness or diaphoresis). H/o HTN, MI, DM, CAD.   Alert, NAD, calm, interactive, resps e/u, speaking in clear complete sentences, no dyspnea noted, skin W&D, VSS.

## 2018-02-20 NOTE — Progress Notes (Signed)
[facility]  Accept note  Patient is a 72yo man with PMH of CAD (likely), presents with chest pain and tachycardia, possibly Afib vs. Atrial tachycardia.  He had initial EKG with some ST depression, negative Troponin X 1.  Only takes his medications intermittently and is supposed to be on a beta blocker.  Currently with HR of 76, EDP Dr. Madilyn Hookees will provide with low dose beta blocker.  Otherwise stable, BP 129/85.  He will need re-initiation of medications and chest pain work up.    Debe CoderEmily Mullen, MD

## 2018-02-20 NOTE — Progress Notes (Signed)
ANTICOAGULATION CONSULT NOTE - Initial Consult  Pharmacy Consult for Heparin Indication: ACS/STEMI  No Known Allergies  Patient Measurements: Height: 5\' 10"  (177.8 cm) Weight: 179 lb 9.6 oz (81.5 kg) IBW/kg (Calculated) : 73 Heparin Dosing Weight: 81.5  Vital Signs: Temp: 98.2 F (36.8 C) (08/03 1634) Temp Source: Oral (08/03 1634) BP: 156/76 (08/03 1905) Pulse Rate: 63 (08/03 1905)  Labs: Recent Labs    02/20/18 0628 02/20/18 1046 02/20/18 1615  HGB 14.6  --   --   HCT 42.8  --   --   PLT 192  --   --   CREATININE 0.83  --   --   TROPONINI <0.03 0.21* 1.94*    Estimated Creatinine Clearance: 84.3 mL/min (by C-G formula based on SCr of 0.83 mg/dL).   Medical History: Past Medical History:  Diagnosis Date  . Atrial fibrillation with RVR (HCC)   . Chest pain   . Coronary artery disease   . Diabetes mellitus without complication (HCC)   . EtOH dependence (HCC)   . Hyperlipidemia   . Hypertension   . MI [facility]myocardial infarction) [facility](HCC)      Assessment: 72 yo male with pharmacy consult to dose heparin for ACS/STEMI.   Goal of Therapy:  Heparin level 0.3-0.7 units/ml Monitor platelets by anticoagulation protocol: Yes   Plan:  Heparin 4000 units IV x 1 Heparin drip at 1000 units/hr Heparin level in 8 hours Daily Heparin level Monitor for s/sx of bleeding  Tyheem Boughner A. Jeanella CrazePierce, PharmD, BCPS Clinical Pharmacist [place] Pager: [phone] Please utilize Amion for appropriate phone number to reach the unit pharmacist [facility])   02/20/2018,7:34 PM

## 2018-02-20 NOTE — Progress Notes (Signed)
Second troponin 0.21.  Repeat ekg NSR. Query very slight st elevation V2  pe: Gen: lying in bed in no acute distress. Denies cp Resp: no increased work of breathing CV: RRR no LE edema  Plan  1. Continue to trend troponin. If next draw elevated will consult cardiology 2. Repeat ekg in am 3. See H&P   Clydie BraunKaren m. Black, np

## 2018-02-20 NOTE — ED Notes (Signed)
[facility]

## 2018-02-20 NOTE — Progress Notes (Signed)
Lab called a critical troponin on patient, NP notified, pt has no CP. Will continue to monitor.

## 2018-02-21 ENCOUNTER — Other Ambulatory Visit: Payer: Self-pay

## 2018-02-21 ENCOUNTER — Inpatient Hospital Stay (HOSPITAL_COMMUNITY): Payer: BLUE CROSS/BLUE SHIELD

## 2018-02-21 DIAGNOSIS — K5900 Constipation, unspecified: Secondary | ICD-10-CM | POA: Diagnosis not present

## 2018-02-21 DIAGNOSIS — I4891 Unspecified atrial fibrillation: Secondary | ICD-10-CM | POA: Diagnosis not present

## 2018-02-21 DIAGNOSIS — R739 Hyperglycemia, unspecified: Secondary | ICD-10-CM

## 2018-02-21 DIAGNOSIS — N179 Acute kidney failure, unspecified: Secondary | ICD-10-CM | POA: Diagnosis not present

## 2018-02-21 DIAGNOSIS — I25118 Atherosclerotic heart disease of native coronary artery with other forms of angina pectoris: Secondary | ICD-10-CM

## 2018-02-21 DIAGNOSIS — E785 Hyperlipidemia, unspecified: Secondary | ICD-10-CM | POA: Diagnosis not present

## 2018-02-21 DIAGNOSIS — Z8673 Personal history of transient ischemic attack (TIA), and cerebral infarction without residual deficits: Secondary | ICD-10-CM | POA: Diagnosis not present

## 2018-02-21 DIAGNOSIS — Z9114 Patient's other noncompliance with medication regimen: Secondary | ICD-10-CM | POA: Diagnosis not present

## 2018-02-21 DIAGNOSIS — I361 Nonrheumatic tricuspid (valve) insufficiency: Secondary | ICD-10-CM | POA: Diagnosis not present

## 2018-02-21 DIAGNOSIS — Z01810 Encounter for preprocedural cardiovascular examination: Secondary | ICD-10-CM | POA: Diagnosis not present

## 2018-02-21 DIAGNOSIS — J90 Pleural effusion, not elsewhere classified: Secondary | ICD-10-CM | POA: Diagnosis not present

## 2018-02-21 DIAGNOSIS — I2510 Atherosclerotic heart disease of native coronary artery without angina pectoris: Secondary | ICD-10-CM | POA: Diagnosis not present

## 2018-02-21 DIAGNOSIS — R0902 Hypoxemia: Secondary | ICD-10-CM | POA: Diagnosis not present

## 2018-02-21 DIAGNOSIS — I208 Other forms of angina pectoris: Secondary | ICD-10-CM

## 2018-02-21 DIAGNOSIS — E8770 Fluid overload, unspecified: Secondary | ICD-10-CM | POA: Diagnosis not present

## 2018-02-21 DIAGNOSIS — R7303 Prediabetes: Secondary | ICD-10-CM

## 2018-02-21 DIAGNOSIS — Z9119 Patient's noncompliance with other medical treatment and regimen: Secondary | ICD-10-CM | POA: Diagnosis not present

## 2018-02-21 DIAGNOSIS — I214 Non-ST elevation (NSTEMI) myocardial infarction: Principal | ICD-10-CM

## 2018-02-21 DIAGNOSIS — R079 Chest pain, unspecified: Secondary | ICD-10-CM | POA: Diagnosis not present

## 2018-02-21 DIAGNOSIS — I10 Essential (primary) hypertension: Secondary | ICD-10-CM | POA: Diagnosis not present

## 2018-02-21 DIAGNOSIS — D696 Thrombocytopenia, unspecified: Secondary | ICD-10-CM | POA: Diagnosis not present

## 2018-02-21 DIAGNOSIS — Z951 Presence of aortocoronary bypass graft: Secondary | ICD-10-CM | POA: Diagnosis not present

## 2018-02-21 DIAGNOSIS — I25110 Atherosclerotic heart disease of native coronary artery with unstable angina pectoris: Secondary | ICD-10-CM | POA: Diagnosis not present

## 2018-02-21 DIAGNOSIS — Z23 Encounter for immunization: Secondary | ICD-10-CM | POA: Diagnosis not present

## 2018-02-21 DIAGNOSIS — E1165 Type 2 diabetes mellitus with hyperglycemia: Secondary | ICD-10-CM | POA: Diagnosis not present

## 2018-02-21 DIAGNOSIS — I252 Old myocardial infarction: Secondary | ICD-10-CM | POA: Diagnosis not present

## 2018-02-21 DIAGNOSIS — Z7902 Long term (current) use of antithrombotics/antiplatelets: Secondary | ICD-10-CM | POA: Diagnosis not present

## 2018-02-21 DIAGNOSIS — F1020 Alcohol dependence, uncomplicated: Secondary | ICD-10-CM | POA: Diagnosis not present

## 2018-02-21 DIAGNOSIS — Z8249 Family history of ischemic heart disease and other diseases of the circulatory system: Secondary | ICD-10-CM | POA: Diagnosis not present

## 2018-02-21 DIAGNOSIS — D62 Acute posthemorrhagic anemia: Secondary | ICD-10-CM | POA: Diagnosis not present

## 2018-02-21 DIAGNOSIS — E782 Mixed hyperlipidemia: Secondary | ICD-10-CM | POA: Diagnosis not present

## 2018-02-21 DIAGNOSIS — R0602 Shortness of breath: Secondary | ICD-10-CM | POA: Diagnosis not present

## 2018-02-21 DIAGNOSIS — Z79899 Other long term (current) drug therapy: Secondary | ICD-10-CM | POA: Diagnosis not present

## 2018-02-21 LAB — GLUCOSE, CAPILLARY
GLUCOSE-CAPILLARY: 141 mg/dL — AB (ref 70–99)
GLUCOSE-CAPILLARY: 190 mg/dL — AB (ref 70–99)
Glucose-Capillary: 154 mg/dL — ABNORMAL HIGH (ref 70–99)
Glucose-Capillary: 143 mg/dL — ABNORMAL HIGH (ref 70–99)

## 2018-02-21 LAB — HEPARIN LEVEL (UNFRACTIONATED)
HEPARIN UNFRACTIONATED: 0.73 [IU]/mL — AB (ref 0.30–0.70)
HEPARIN UNFRACTIONATED: 0.33 [IU]/mL (ref 0.30–0.70)
HEPARIN UNFRACTIONATED: 0.28 [IU]/mL — AB (ref 0.30–0.70)

## 2018-02-21 LAB — ECHOCARDIOGRAM COMPLETE
Height: 70 in
WEIGHTICAEL: 2800 [oz_av]

## 2018-02-21 LAB — TROPONIN I: TROPONIN I: 4.46 ng/mL — AB (ref <0.03)

## 2018-02-21 MED ORDER — METOPROLOL TARTRATE 25 MG PO TABS
25.0000 mg | ORAL_TABLET | Freq: Two times a day (BID) | ORAL | Status: DC
  Administered 2018-02-21 – 2018-02-25 (×9): 25 mg via ORAL
  Filled 2018-02-21 (×9): qty 1

## 2018-02-21 MED ORDER — PNEUMOCOCCAL VAC POLYVALENT 25 MCG/0.5ML IJ INJ
0.5000 mL | INJECTION | INTRAMUSCULAR | Status: AC
  Administered 2018-02-22: 0.5 mL via INTRAMUSCULAR
  Filled 2018-02-21 (×2): qty 0.5

## 2018-02-21 MED ORDER — ATORVASTATIN CALCIUM 40 MG PO TABS
40.0000 mg | ORAL_TABLET | Freq: Every day | ORAL | Status: DC
  Administered 2018-02-21: 40 mg via ORAL
  Filled 2018-02-21: qty 1

## 2018-02-21 MED ORDER — METOPROLOL TARTRATE 12.5 MG HALF TABLET
12.5000 mg | ORAL_TABLET | Freq: Once | ORAL | Status: AC
  Administered 2018-02-21: 12.5 mg via ORAL
  Filled 2018-02-21: qty 1

## 2018-02-21 NOTE — Progress Notes (Signed)
ANTICOAGULATION CONSULT NOTE   Pharmacy Consult for Heparin Indication: ACS/STEMI  No Known Allergies  Patient Measurements: Height: 5\' 10"  (177.8 cm) Weight: 175 lb (79.4 kg)(scale b) IBW/kg (Calculated) : 73 Heparin Dosing Weight: 81.5  Vital Signs: Temp: 98.6 F (37 C) (08/04 1313) Temp Source: Oral (08/04 1313) BP: 124/66 (08/04 1313) Pulse Rate: 64 (08/04 1313)  Labs: Recent Labs    02/20/18 0628 02/20/18 1046 02/20/18 1615 02/20/18 2230 02/21/18 0334 02/21/18 1338  HGB 14.6  --   --   --   --   --   HCT 42.8  --   --   --   --   --   PLT 192  --   --   --   --   --   HEPARINUNFRC  --   --   --   --  0.73* 0.33  CREATININE 0.83  --   --   --   --   --   TROPONINI <0.03 0.21* 1.94* 4.46*  --   --     Estimated Creatinine Clearance: 84.3 mL/min (by C-G formula based on SCr of 0.83 mg/dL).   Medical History: Past Medical History:  Diagnosis Date  . Atrial fibrillation with RVR (HCC)   . Chest pain   . Coronary artery disease   . Diabetes mellitus without complication (HCC)   . EtOH dependence (HCC)   . Hyperlipidemia   . Hypertension   . MI (myocardial infarction) [facility](HCC)      Assessment: 72 yo male with pharmacy consult to dose heparin for ACS/STEMI. CBC good, renal function good. Heparin level within goal range at 0.33.   Goal of Therapy:  Heparin level 0.3-0.7 units/ml Monitor platelets by anticoagulation protocol: Yes   Plan:  Continue heparin at 900 units/hr 2000 HL   Jeanella Caraathy Sorrel Cassetta, PharmD, [facility] Center[facility]M Clinical Pharmacist Please see AMION for all Pharmacists' Contact Phone Numbers 02/21/2018, 3:06 PM

## 2018-02-21 NOTE — Progress Notes (Signed)
Progress Note  Patient Name: Shane Yates Date of Encounter: 02/21/2018  Primary Cardiologist: No primary care provider on file.   Subjective   He has had some episodic chest heaviness but denies any currently.  He also denies palpitations and shortness of breath.  His wife and son, Shane Yates, are in the room.  His son is able to translate as the patient speaks Hindi. The patient apparently underwent coronary angiography in [place] about 5 years ago.  At that time he was told he needed stents but the patient refused.  His son says his father is quite stubborn.  His parents are also suffering from depression as their eldest son died of an accident about 12 years ago in [place].  Inpatient Medications    Scheduled Meds: . amLODipine  5 mg Oral Daily  . aspirin EC  325 mg Oral Daily  . atorvastatin  20 mg Oral q1800  . clopidogrel  75 mg Oral Daily  . folic acid  1 mg Oral Daily  . insulin aspart  0-5 Units Subcutaneous QHS  . insulin aspart  0-9 Units Subcutaneous TID WC  . metoprolol tartrate  12.5 mg Oral BID  . multivitamin with minerals  1 tablet Oral Daily  . thiamine  100 mg Oral Daily   Or  . thiamine  100 mg Intravenous Daily   Continuous Infusions: . heparin 900 Units/hr (02/21/18 0707)   PRN Meds: acetaminophen, gi cocktail, LORazepam **OR** LORazepam, morphine injection, nitroGLYCERIN, ondansetron (ZOFRAN) IV   Vital Signs    Vitals:   02/20/18 1634 02/20/18 1905 02/20/18 2128 02/21/18 0640  BP: (!) 143/73 (!) 156/76 127/80 (!) 159/99  Pulse: 61 63 64 71  Resp: 18  17 16   Temp: 98.2 F (36.8 C)  98.2 F (36.8 C) 98.2 F (36.8 C)  TempSrc: Oral  Oral Oral  SpO2: 98%  96% 95%  Weight:    175 lb (79.4 kg)  Height:        Intake/Output Summary (Last 24 hours) at 02/21/2018 1005 Last data filed at 02/21/2018 0956 Gross per 24 hour  Intake 960 ml  Output -  Net 960 ml   Filed Weights   02/20/18 0639 02/20/18 1050 02/21/18 0640  Weight: 182 lb 5.1 oz (82.7 kg) 179  lb 9.6 oz (81.5 kg) 175 lb (79.4 kg)    Telemetry    Sinus tachycardia with PACs- Personally Reviewed  ECG    Sinus tachycardia with PACs- Personally Reviewed  Physical Exam   GEN: No acute distress.   Neck: No JVD Cardiac: RRR, no murmurs, rubs, or gallops.  Respiratory: Clear to auscultation bilaterally. GI: Soft, nontender, non-distended  MS: No edema; No deformity. Neuro:  Nonfocal  Psych: Normal affect   Labs    Chemistry Recent Labs  Lab 02/20/18 0628  NA 138  K 3.6  CL 101  CO2 28  GLUCOSE 153*  BUN 13  CREATININE 0.83  CALCIUM 9.1  PROT 7.1  ALBUMIN 4.2  AST 22  ALT 17  ALKPHOS 69  BILITOT 1.0  GFRNONAA >60  GFRAA >60  ANIONGAP 9     Hematology Recent Labs  Lab 02/20/18 0628  WBC 7.4  RBC 4.69  HGB 14.6  HCT 42.8  MCV 91.3  MCH 31.1  MCHC 34.1  RDW 12.8  PLT 192    Cardiac Enzymes Recent Labs  Lab 02/20/18 0628 02/20/18 1046 02/20/18 1615 02/20/18 2230  TROPONINI <0.03 0.21* 1.94* 4.46*   No results for input(s):  TROPIPOC in the last 168 hours.   BNPNo results for input(s): BNP, PROBNP in the last 168 hours.   DDimer No results for input(s): DDIMER in the last 168 hours.   Radiology    Dg Chest 2 View  Result Date: 02/20/2018 CLINICAL DATA:  Chest pain for 2 hours, pain down RIGHT arm. EXAM: CHEST - 2 VIEW COMPARISON:  None. FINDINGS: The heart size and mediastinal contours are within normal limits. Both lungs are clear. The visualized skeletal structures are unremarkable. IMPRESSION: No active cardiopulmonary disease. Electronically Signed   By: Bary Richard M.D.   On: 02/20/2018 07:26    Cardiac Studies   Echocardiogram is pending  Patient Profile     72 y.o. male with a hx of CAD, HTN, HLD, DM, and alcohol abuse who is being seen today for the evaluation of chest pain and elevated troponin at the request of [facility].  Assessment & Plan    1.  Coronary artery disease with non-STEMI: As noted above, he  underwent coronary angiography in [place] roughly 5 years ago and was told he needed stents but refused.  Troponins are up to 4.46.  He is presently asymptomatic.  I had a long discussion with his son, Shane Sark, and encouraged him to speak to his father about undergoing coronary angiography tomorrow.  He will speak further with him.  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 For the time being, continue aspirin, Plavix, heparin, Lipitor, and metoprolol.  I will increase metoprolol to 25 mg twice daily.  I will make him n.p.o. at midnight in the event he decides to proceed with cardiac catheterization.  2.  Hypertension: Blood pressure remains elevated.  I will increase metoprolol to 25 mg twice daily.  Amlodipine may need to be increased.  This will need further monitoring.  3.  Hyperlipidemia: LDL 118.  I will increase Lipitor to 40 mg given non-STEMI.  4.  Hyperglycemia: Hemoglobin A1c is 6%.  It is in the prediabetic range.  He will need dietary modification.    For questions or updates, please contact [facility] Please consult [email or web address] for contact info under Cardiology/STEMI.      Signed, Prentice Docker, MD  02/21/2018, 10:05 AM

## 2018-02-21 NOTE — Progress Notes (Addendum)
PROGRESS NOTE  Shane Yates WUJ:[id number] DOB: [birth year] DOA: 02/20/2018 PCP: Patient, No Pcp Per  HPI/Recap of past 24 hours:  Shane Yates is a 72 y.o. male with medical history significant cad, afib on plavix, htn, diabetes, non-compliance etoh use is admitted to 3 east room [facility] for chest pain rule out. The patient apparently underwent coronary angiography in [place] about 5 years ago.  At that time he was told he needed stents but the patient refused. In the ED, ECG showed irregular tachycardia. Troponin was initially negative. He was admitted to the hospitalist team. Since admission, his troponin has risen to 4.4. He had another episode of chest pain last evening that improved with NTG.  He denies any chest pain now but stated that he has an epigastric pain when is palpated.  He has non-STEMI cardiology was consulted given concern for ACS,     Assessment/Plan: Principal Problem:   Chest pain Active Problems:   Coronary artery disease   Diabetes mellitus without complication (HCC)   Hyperlipidemia   Hypertension   Atrial fibrillation with RVR (HCC)   EtOH dependence (HCC)  NSTEMI History of CAD The patient has a history of CAD that has been treated medically. He presents with recurrent chest discomfort that occurs with exertion and now with rest. He has nonspecific changes on his ECG, and troponin is rising, consistent with probable NSTEMI.  Cardiology recommend ongoing medical treatment at this time with continued discussion about treatment options . -Continue to trend troponin  -Continue to monitor symptoms. Repeat NTG for recurrent CP -Continue heparin gtt -Continue home ASA, clopidogrel -Continue atorvastatin  -Continue metoprolol  -Echocardiogram results pending -Lipid panel, HgA1c ordered by primary team -Has not made up his mind if he wants to proceed with cardiac catheterization tomorrow but the cardiologist has made him n.p.o. in the event that he  agrees finally  Addendum 6:45 PM.:  Patient initially did not want any intervention done but he has finally agreed.  so he will have cardiac catheterization tomorrow    Irregular heart rhythm  cardiology does not think that patient has atrial fibrillation whether he thinks it is a PACs some kind of irregular tachycardia -Continue ongoing monitoring on telemetry  -Metoprolol can likely be up-titrated for better control  HTN BP elevated on admission but improved now -Continue metoprolol, amlodipine   EtOH use.  -ciwa protocol   Code Status: Full code  Family Communication: Son who was at bedside  Disposition Plan: Home possibly 1 to 2 days   Consultants:  Cardiology  Procedures:  2D echo  Antimicrobials:  None  DVT prophylaxis: Plavix aspirin   Objective: Vitals:   02/21/18 1227 02/21/18 1313  BP: (!) 144/122 124/66  Pulse: (!) 125 64  Resp:  20  Temp:  98.6 F (37 C)  SpO2:  97%    Intake/Output Summary (Last 24 hours) at 02/21/2018 1709 Last data filed at 02/21/2018 1500 Gross per 24 hour  Intake 925.72 ml  Output -  Net 925.72 ml   Filed Weights   02/20/18 0639 02/20/18 1050 02/21/18 0640  Weight: 82.7 kg (182 lb 5.1 oz) 81.5 kg (179 lb 9.6 oz) 79.4 kg (175 lb)   Body mass index is 25.11 kg/m.  Exam:     General:  Appears calm and comfortable lying in bed with no acute distress  Eyes:  PERRL, EOMI, normal lids, iris  ENT:  grossly normal hearing, lips & tongue, mucous membranes of his  mouth are moist and pink  Neck:  no LAD, masses or thyromegaly  Cardiovascular:  RRR, heart sounds are distant but I hear no m/r/g. No LE edema.   Respiratory:  Normal effort breath sounds are distant but clear here no wheezing no rhonchi no crackles  Abdomen:  soft, ntnd, positive bowel sounds throughout no guarding or rebounding  Skin:  no rash or induration seen on limited exam  Musculoskeletal:  grossly normal tone BUE/BLE, good ROM, no bony  abnormality  Psychiatric:  grossly normal mood and affect, speech fluent and appropriate, AOx3  Neurologic:  CN 2-12 grossly intact, moves all extremities in coordinated fashion, sensation intact follows commands bilateral grip 5 out of 5 lower extremity strength 5 out of 5 bilaterally     Data Reviewed: CBC: Recent Labs  Lab 02/20/18 0628  WBC 7.4  NEUTROABS 4.8  HGB 14.6  HCT 42.8  MCV 91.3  PLT 192   Basic Metabolic Panel: Recent Labs  Lab 02/20/18 0628  NA 138  K 3.6  CL 101  CO2 28  GLUCOSE 153*  BUN 13  CREATININE 0.83  CALCIUM 9.1   GFR: Estimated Creatinine Clearance: 84.3 mL/min (by C-G formula based on SCr of 0.83 mg/dL). Liver Function Tests: Recent Labs  Lab 02/20/18 0628  AST 22  ALT 17  ALKPHOS 69  BILITOT 1.0  PROT 7.1  ALBUMIN 4.2   No results for input(s): LIPASE, AMYLASE in the last 168 hours. No results for input(s): AMMONIA in the last 168 hours. Coagulation Profile: No results for input(s): INR, PROTIME in the last 168 hours. Cardiac Enzymes: Recent Labs  Lab 02/20/18 0628 02/20/18 1046 02/20/18 1615 02/20/18 2230  TROPONINI <0.03 0.21* 1.94* 4.46*   BNP (last 3 results) No results for input(s): PROBNP in the last 8760 hours. HbA1C: Recent Labs    02/20/18 1046  HGBA1C 6.0*   CBG: Recent Labs  Lab 02/20/18 1632 02/20/18 2127 02/21/18 0748 02/21/18 1129 02/21/18 1652  GLUCAP 191* 188* 141* 190* 154*   Lipid Profile: Recent Labs    02/20/18 1046  CHOL 201*  HDL 56  LDLCALC 118*  TRIG 135  CHOLHDL 3.6   Thyroid Function Tests: Recent Labs    02/20/18 1046  TSH 0.964   Anemia Panel: No results for input(s): VITAMINB12, FOLATE, FERRITIN, TIBC, IRON, RETICCTPCT in the last 72 hours. Urine analysis: No results found for: COLORURINE, APPEARANCEUR, LABSPEC, PHURINE, GLUCOSEU, HGBUR, BILIRUBINUR, KETONESUR, PROTEINUR, UROBILINOGEN, NITRITE, LEUKOCYTESUR Sepsis  Labs: @LABRCNTIP (procalcitonin:4,lacticidven:4)  )No results found for this or any previous visit (from the past 240 hour(s)).    Studies: No results found.  Scheduled Meds: . amLODipine  5 mg Oral Daily  . aspirin EC  325 mg Oral Daily  . atorvastatin  40 mg Oral q1800  . clopidogrel  75 mg Oral Daily  . folic acid  1 mg Oral Daily  . insulin aspart  0-5 Units Subcutaneous QHS  . insulin aspart  0-9 Units Subcutaneous TID WC  . metoprolol tartrate  25 mg Oral BID  . multivitamin with minerals  1 tablet Oral Daily  . [START ON 02/22/2018] pneumococcal 23 valent vaccine  0.5 mL Intramuscular Tomorrow-1000  . thiamine  100 mg Oral Daily   Or  . thiamine  100 mg Intravenous Daily    Continuous Infusions: . heparin 900 Units/hr (02/21/18 0707)     LOS: 1 day     Myrtie Neither, MD [facility]  To reach me or the doctor on  call, go to: [email or web address] Password [email or web address]  02/21/2018, 5:09 PM

## 2018-02-21 NOTE — Progress Notes (Signed)
ANTICOAGULATION CONSULT NOTE   Pharmacy Consult for Heparin Indication: ACS/STEMI  No Known Allergies  Patient Measurements: Height: 5\' 10"  (177.8 cm) Weight: 179 lb 9.6 oz (81.5 kg) IBW/kg (Calculated) : 73 Heparin Dosing Weight: 81.5  Vital Signs: Temp: 98.2 F (36.8 C) (08/03 2128) Temp Source: Oral (08/03 2128) BP: 127/80 (08/03 2128) Pulse Rate: 64 (08/03 2128)  Labs: Recent Labs    02/20/18 0628 02/20/18 1046 02/20/18 1615 02/20/18 2230 02/21/18 0334  HGB 14.6  --   --   --   --   HCT 42.8  --   --   --   --   PLT 192  --   --   --   --   HEPARINUNFRC  --   --   --   --  0.73*  CREATININE 0.83  --   --   --   --   TROPONINI <0.03 0.21* 1.94* 4.46*  --     Estimated Creatinine Clearance: 84.3 mL/min (by C-G formula based on SCr of 0.83 mg/dL).   Medical History: Past Medical History:  Diagnosis Date  . Atrial fibrillation with RVR (HCC)   . Chest pain   . Coronary artery disease   . Diabetes mellitus without complication (HCC)   . EtOH dependence (HCC)   . Hyperlipidemia   . Hypertension   . MI (myocardial infarction) [facility](HCC)      Assessment: 72 yo male with pharmacy consult to dose heparin for ACS/STEMI. CBC good, renal function good. Heparin level just above goal. Troponin is rising.   Goal of Therapy:  Heparin level 0.3-0.7 units/ml Monitor platelets by anticoagulation protocol: Yes   Plan:  Dec heparin to 900 units/hr 1400 HL  Abran DukeJames Jkayla Spiewak, PharmD, BCPS Clinical Pharmacist Phone: [phone]

## 2018-02-21 NOTE — Progress Notes (Signed)
Patient agreed to proceed with the coronary angiography tomorrow, MD and cardialogist both made aware. Will make patient NPO after midnight per MD's order.

## 2018-02-21 NOTE — Plan of Care (Signed)
  Problem: Clinical Measurements: Goal: Ability to maintain clinical measurements within normal limits will improve Outcome: Progressing   Problem: Clinical Measurements: Goal: Respiratory complications will improve Outcome: Progressing   Problem: Clinical Measurements: Goal: Cardiovascular complication will be avoided Outcome: Progressing   

## 2018-02-21 NOTE — Progress Notes (Signed)
  Echocardiogram 2D Echocardiogram has been performed.  Shane Yates T [name] 02/21/2018, 1:58 PM

## 2018-02-21 NOTE — Plan of Care (Signed)
°  Problem: Coping: °Goal: Level of anxiety will decrease °Outcome: Progressing °  °

## 2018-02-21 NOTE — Progress Notes (Signed)
ANTICOAGULATION CONSULT NOTE   Pharmacy Consult for Heparin Indication: ACS/STEMI  No Known Allergies  Patient Measurements: Height: 5\' 10"  (177.8 cm) Weight: 175 lb (79.4 kg)(scale b) IBW/kg (Calculated) : 73 Heparin Dosing Weight: 81.5  Vital Signs: Temp: 98.3 F (36.8 C) (08/04 1948) Temp Source: Oral (08/04 1948) BP: 120/86 (08/04 1948) Pulse Rate: 76 (08/04 1948)  Labs: Recent Labs    02/20/18 0628 02/20/18 1046 02/20/18 1615 02/20/18 2230 02/21/18 0334 02/21/18 1338 02/21/18 1952  HGB 14.6  --   --   --   --   --   --   HCT 42.8  --   --   --   --   --   --   PLT 192  --   --   --   --   --   --   HEPARINUNFRC  --   --   --   --  0.73* 0.33 0.28*  CREATININE 0.83  --   --   --   --   --   --   TROPONINI <0.03 0.21* 1.94* 4.46*  --   --   --     Estimated Creatinine Clearance: 84.3 mL/min (by C-G formula based on SCr of 0.83 mg/dL).   Medical History: Past Medical History:  Diagnosis Date  . Atrial fibrillation with RVR (HCC)   . Chest pain   . Coronary artery disease   . Diabetes mellitus without complication (HCC)   . EtOH dependence (HCC)   . Hyperlipidemia   . Hypertension   . MI (myocardial infarction) [facility](HCC)      Assessment: 72 yo male with pharmacy consult to dose heparin for ACS/STEMI. CBC good, renal function good. Heparin level below goal range at 0.28.   Goal of Therapy:  Heparin level 0.3-0.7 units/ml Monitor platelets by anticoagulation protocol: Yes   Plan:  Increase heparin to 1050 units/hr Heparin level in 8 hours   Corbett Moulder A. Jeanella CrazePierce, PharmD, BCPS Clinical Pharmacist [place] Pager: [phone] Please utilize Amion for appropriate phone number to reach the unit pharmacist [facility])   02/21/2018, 8:33 PM

## 2018-02-22 DIAGNOSIS — I4891 Unspecified atrial fibrillation: Secondary | ICD-10-CM

## 2018-02-22 DIAGNOSIS — E782 Mixed hyperlipidemia: Secondary | ICD-10-CM

## 2018-02-22 LAB — BASIC METABOLIC PANEL
ANION GAP: 10 (ref 5–15)
BUN: 18 mg/dL (ref 8–23)
CALCIUM: 9 mg/dL (ref 8.9–10.3)
CHLORIDE: 103 mmol/L (ref 98–111)
CO2: 26 mmol/L (ref 22–32)
Creatinine, Ser: 1.14 mg/dL (ref 0.61–1.24)
GFR calc non Af Amer: >60 mL/min (ref >60)
Glucose, Bld: 195 mg/dL — ABNORMAL HIGH (ref 70–99)
Potassium: 3.5 mmol/L (ref 3.5–5.1)
Sodium: 139 mmol/L (ref 135–145)

## 2018-02-22 LAB — CBC
HEMATOCRIT: 44 % (ref 39.0–52.0)
HEMOGLOBIN: 14.3 g/dL (ref 13.0–17.0)
MCH: 30 pg (ref 26.0–34.0)
MCHC: 32.5 g/dL (ref 30.0–36.0)
MCV: 92.4 fL (ref 78.0–100.0)
Platelets: 204 10*3/uL (ref 150–400)
RBC: 4.76 MIL/uL (ref 4.22–5.81)
RDW: 12.6 % (ref 11.5–15.5)
WBC: 8.6 10*3/uL (ref 4.0–10.5)

## 2018-02-22 LAB — GLUCOSE, CAPILLARY
GLUCOSE-CAPILLARY: 150 mg/dL — AB (ref 70–99)
GLUCOSE-CAPILLARY: 220 mg/dL — AB (ref 70–99)
GLUCOSE-CAPILLARY: 102 mg/dL — AB (ref 70–99)
Glucose-Capillary: 141 mg/dL — ABNORMAL HIGH (ref 70–99)

## 2018-02-22 LAB — HEPARIN LEVEL (UNFRACTIONATED): HEPARIN UNFRACTIONATED: 0.27 [IU]/mL — AB (ref 0.30–0.70)

## 2018-02-22 MED ORDER — SODIUM CHLORIDE 0.9 % IV SOLN
INTRAVENOUS | Status: DC
  Administered 2018-02-23: 05:00:00 via INTRAVENOUS

## 2018-02-22 MED ORDER — SODIUM CHLORIDE 0.9 % IV SOLN: 250.0000 mL | INTRAVENOUS | Status: DC | PRN

## 2018-02-22 MED ORDER — SODIUM CHLORIDE 0.9% FLUSH: 3.0000 mL | INTRAVENOUS | Status: DC | PRN

## 2018-02-22 MED ORDER — SODIUM CHLORIDE 0.9% FLUSH
3.0000 mL | Freq: Two times a day (BID) | INTRAVENOUS | Status: DC
  Administered 2018-02-23: 12:00:00 via INTRAVENOUS

## 2018-02-22 MED ORDER — ASPIRIN 81 MG PO CHEW
81.0000 mg | CHEWABLE_TABLET | ORAL | Status: AC
  Administered 2018-02-23: 81 mg via ORAL
  Filled 2018-02-22 (×2): qty 1

## 2018-02-22 MED ORDER — ROSUVASTATIN CALCIUM 10 MG PO TABS
10.0000 mg | ORAL_TABLET | Freq: Every day | ORAL | Status: DC
  Administered 2018-02-22 – 2018-02-23 (×2): 10 mg via ORAL
  Filled 2018-02-22 (×2): qty 1

## 2018-02-22 NOTE — Progress Notes (Addendum)
Progress Note  Patient Name: Shane Yates Date of Encounter: 02/22/2018  Primary Cardiologist: New to [facility]  Subjective   History obtained using bedside computer interpreter. Only speaks Hindi. Son is on the way. Denies chest pain or heaviness despite elevated enzymes.   Inpatient Medications    Scheduled Meds: . amLODipine  5 mg Oral Daily  . aspirin EC  325 mg Oral Daily  . atorvastatin  40 mg Oral q1800  . clopidogrel  75 mg Oral Daily  . folic acid  1 mg Oral Daily  . insulin aspart  0-5 Units Subcutaneous QHS  . insulin aspart  0-9 Units Subcutaneous TID WC  . metoprolol tartrate  25 mg Oral BID  . multivitamin with minerals  1 tablet Oral Daily  . pneumococcal 23 valent vaccine  0.5 mL Intramuscular Tomorrow-1000  . thiamine  100 mg Oral Daily   Or  . thiamine  100 mg Intravenous Daily   Continuous Infusions: . heparin 1,150 Units/hr (02/22/18 1027)   PRN Meds: acetaminophen, gi cocktail, LORazepam **OR** LORazepam, morphine injection, nitroGLYCERIN, ondansetron (ZOFRAN) IV   Vital Signs    Vitals:   02/21/18 1948 02/21/18 2216 02/22/18 0348 02/22/18 0837  BP: 120/86 140/84 127/64 125/63  Pulse: 76 89 76 74  Resp: 18  18   Temp: 98.3 F (36.8 C)  98.5 F (36.9 C)   TempSrc: Oral  Oral   SpO2: 99%  96%   Weight:   177 lb 8 oz (80.5 kg)   Height:        Intake/Output Summary (Last 24 hours) at 02/22/2018 1117 Last data filed at 02/22/2018 0700 Gross per 24 hour  Intake 595.17 ml  Output -  Net 595.17 ml   Filed Weights   02/20/18 1050 02/21/18 0640 02/22/18 0348  Weight: 179 lb 9.6 oz (81.5 kg) 175 lb (79.4 kg) 177 lb 8 oz (80.5 kg)   Physical Exam   General: Elderly, NAD Skin: Warm, dry, intact  Head: Normocephalic, atraumatic, clear, moist mucus membranes. Neck: Negative for carotid bruits. No JVD Lungs:Clear to ausculation bilaterally. No wheezes, rales, or rhonchi. Breathing is unlabored. Cardiovascular: RRR with S1 S2. No murmurs, rubs or  gallops Abdomen: Soft, non-tender, non-distended with normoactive bowel sounds. No hepatomegaly, No rebound/guarding. No obvious abdominal masses. MSK: Strength and tone appear normal for age. 5/5 in all extremities Extremities: No edema. No clubbing or cyanosis. DP/PT pulses 2+ bilaterally Neuro: Alert and oriented. No focal deficits. No facial asymmetry. MAE spontaneously. Psych: Responds to questions appropriately with normal affect.    Labs    Chemistry Recent Labs  Lab 02/20/18 0628  NA 138  K 3.6  CL 101  CO2 28  GLUCOSE 153*  BUN 13  CREATININE 0.83  CALCIUM 9.1  PROT 7.1  ALBUMIN 4.2  AST 22  ALT 17  ALKPHOS 69  BILITOT 1.0  GFRNONAA >60  GFRAA >60  ANIONGAP 9     Hematology Recent Labs  Lab 02/20/18 0628 02/22/18 0548  WBC 7.4 8.6  RBC 4.69 4.76  HGB 14.6 14.3  HCT 42.8 44.0  MCV 91.3 92.4  MCH 31.1 30.0  MCHC 34.1 32.5  RDW 12.8 12.6  PLT 192 204    Cardiac Enzymes Recent Labs  Lab 02/20/18 0628 02/20/18 1046 02/20/18 1615 02/20/18 2230  TROPONINI <0.03 0.21* 1.94* 4.46*   No results for input(s): TROPIPOC in the last 168 hours.   BNPNo results for input(s): BNP, PROBNP in the last 168  hours.   DDimer No results for input(s): DDIMER in the last 168 hours.   Radiology    No results found.  Telemetry    02/22/18 NSR - Personally Reviewed  ECG    02/21/18 NSR with PAC's- Personally Reviewed  Cardiac Studies   Echocardiogram 02/21/18: Study Conclusions  - Left ventricle: Due to varibility in heart rate EF difficult to   esitmated. The cavity size was normal. Systolic function was   mildly to moderately reduced. The estimated ejection fraction was   in the range of 40% to 45%. Wall motion was normal; there were no   regional wall motion abnormalities. - Aortic root: The aortic root was mildly dilated. - Right atrium: The atrium was mildly dilated.  Patient Profile     72 y.o. male with a hx of CAD, HTN, HLD, DM, and  alcohol abusewho is being seen today for the evaluation of chest pain and elevated troponinat the request of [facility].  Assessment & Plan   1. Known CAD with NSTEMI: -Hx of CAD>>>with coronary angiography approximally 5 years ago in [place] in which he was told that he needed stents however he refused per son. Presented to [facility] with episodic chest heaviness and elevated troponin.    -Troponin, <0.03, 0.21, 1.94, 4.46 -Pt will need further ischemic workup, however is very distrusting of physicians according to chart review. Weekend MD had discussion with son and encouraged father to proceed with recommendations with cath planned for today 02/22/18 -Discussed cardiac cath today via computer interpreter along with son who speaks English>>>wthey wish to further discuss with son when he arrives (30 minutes) before orders placed -Continue ASA, Plavix, BB  2. HTN: -Stable, 125/63>127/64>140/84 -Lopressor increased to 25 mg twice daily 02/21/18 -Continue to monitor   3. HLD: -LDL, 118 -Lipitor increased to 40 mg yesterday 02/21/18  4. Hyperglycemia: -Controlled, HbA1c 6 -Encouraged dietary modification and increase activity level    Signed, Georgie Chard NP-C [facility] Pager: [phone] 02/22/2018, 11:17 AM     For questions or updates, please contact   Please consult [email or web address] for contact info under Cardiology/STEMI.  Attending Note:   The patient was seen and examined.  Agree with assessment and plan as noted above.  Changes made to the above note as needed.  Patient seen and independently examined with  Georgie Chard, NP .   We discussed all aspects of the encounter. I agree with the assessment and plan as stated above.  Patient is a 72 year old gentleman with at least 5 years of chest discomfort.  He has been told in the past and needed a heart catheterization but he did not trust the doctor.  He now presents with a non-ST segment elevation myocardial  infarction. Troponin is 4.46.  I discussed with the patient through his son who acted as the interpreter.  He does agree for heart catheterization.  Unfortunately we were unable to add him on to today's schedule.  We will schedule him for tomorrow.  2.  Hyperlipidemia: His LDL is 118.  Total cholesterol is 201. He was on atorvastatin 20 mg every third day.  We may need to change to rosuvastatin.     Discussed the risks, benefits, options.  He understands and agrees to proceed.   I have spent a total of 40 minutes with patient reviewing hospital  notes , telemetry, EKGs, labs and examining patient as well as establishing an assessment and plan that was discussed with the patient. > 50% of time was spent  in direct patient care.   Vesta MixerPhilip J. Kymberly Blomberg, Montez HagemanJr., MD, [facility] HospitalFACC 02/22/2018, 1:01 PM [address],  [address] Office [phone] Pager [phone]

## 2018-02-22 NOTE — Progress Notes (Signed)
ANTICOAGULATION CONSULT NOTE - Follow Up Consult  Pharmacy Consult for Heparin Indication: chest pain/ACS  No Known Allergies  Patient Measurements: Height: 5\' 10"  (177.8 cm) Weight: 177 lb 8 oz (80.5 kg) IBW/kg (Calculated) : 73 Heparin Dosing Weight:  80.5 kg  Vital Signs: Temp: 98.5 F (36.9 C) (08/05 0348) Temp Source: Oral (08/05 0348) BP: 125/63 (08/05 0837) Pulse Rate: 74 (08/05 0837)  Labs: Recent Labs    02/20/18 0628 02/20/18 1046 02/20/18 1615 02/20/18 2230  02/21/18 1338 02/21/18 1952 02/22/18 0548  HGB 14.6  --   --   --   --   --   --  14.3  HCT 42.8  --   --   --   --   --   --  44.0  PLT 192  --   --   --   --   --   --  204  HEPARINUNFRC  --   --   --   --    < > 0.33 0.28* 0.27*  CREATININE 0.83  --   --   --   --   --   --   --   TROPONINI <0.03 0.21* 1.94* 4.46*  --   --   --   --    < > = values in this interval not displayed.    Estimated Creatinine Clearance: 84.3 mL/min (by C-G formula based on SCr of 0.83 mg/dL).  Assessment:  Anticoag: NSTEMI. H/o afib unclear. HL 0.27.CBC WNL  Goal of Therapy:  Heparin level 0.3-0.7 units/ml Monitor platelets by anticoagulation protocol: Yes   Plan:  Increase IV heparin to 1150 units/hr Daily HL, CBC Agrees to cath but does not want any intervention  Kaley Jutras S. Merilynn Finlandobertson, PharmD, [facility] LLCBCPS Clinical Staff Pharmacist Pager [phone]  Misty Stanleyobertson, Ariyel Jeangilles Stillinger 02/22/2018,10:14 AM

## 2018-02-22 NOTE — Progress Notes (Signed)
PROGRESS NOTE  Shane Sailsehal Jakes  ZOX:[id number] DOB: 11/27/1945 DOA: 02/20/2018 PCP: Patient, No Pcp Per   Brief Narrative: Shane Yates is a 72 y.o. male with a history of CAD, AFib on plavix, HTN, T2DM, alcohol use who [facility]. Troponins have risen and [facility] though the patient has initially declined catheterization.   Assessment & Plan: NSTEMI with history of CAD: Reportedly was told he needed stenting but refused at that time. - Continue metoprolol, ASA, plavix, statin, prn NTG and oxygen.  - Catheterization per cardiology.   HTN: Well controlled HLD: Augmented statin Alcohol use: No evidence of withdrawal >48 hours into hospitalization.  Pre-diabetes: HbA1c 6.0%. Recommend outpatient follow up. At that time could consider metformin, though wouldn't start while inpatient due to need for contrast and general distrust of providers with whom he has no rapport yet.   Disposition Plan: Home pending further work up. I've spoken with Dr. Elease HashimotoNahser who agrees that cardiology can assume primary care for the patient at this time. Please call the hospitalists if any assistance is needed.  Consultants:   Cardiology to become primary 8/5.   Subjective: Chest discomfort continues, but improved. No dyspnea, diaphoresis, abd pain.   Objective: BP 116/76 (BP Location: Left Arm)   Pulse 70   Temp 98 F (36.7 C) (Oral)   Resp 18   Ht 5\' 10"  (1.778 m)   Wt 80.5 kg (177 lb 8 oz)   SpO2 96%   BMI 25.47 kg/m    Gen: 72 y.o. male in no distress  Pulm: Non-labored breathing . Clear to auscultation bilaterally.  CV: Regular rate and rhythm. No murmur, rub, or gallop. No JVD, no pedal edema. GI: Abdomen soft, non-tender, non-distended, with normoactive bowel sounds. No organomegaly or masses felt. Ext: Warm, no deformities Skin: No rashes, lesions no ulcers Neuro: Alert and oriented. No focal neurological  deficits. Psych: Judgement and insight appear normal. Mood & affect appropriate.   Data Reviewed: I have personally reviewed following labs and imaging studies  CBC: Recent Labs  Lab 02/20/18 0628 02/22/18 0548  WBC 7.4 8.6  NEUTROABS 4.8  --   HGB 14.6 14.3  HCT 42.8 44.0  MCV 91.3 92.4  PLT 192 204   Basic Metabolic Panel: Recent Labs  Lab 02/20/18 0628  NA 138  K 3.6  CL 101  CO2 28  GLUCOSE 153*  BUN 13  CREATININE 0.83  CALCIUM 9.1   GFR: Estimated Creatinine Clearance: 84.3 mL/min (by C-G formula based on SCr of 0.83 mg/dL). Liver Function Tests: Recent Labs  Lab 02/20/18 0628  AST 22  ALT 17  ALKPHOS 69  BILITOT 1.0  PROT 7.1  ALBUMIN 4.2   No results for input(s): LIPASE, AMYLASE in the last 168 hours. No results for input(s): AMMONIA in the last 168 hours. Coagulation Profile: No results for input(s): INR, PROTIME in the last 168 hours. Cardiac Enzymes: Recent Labs  Lab 02/20/18 0628 02/20/18 1046 02/20/18 1615 02/20/18 2230  TROPONINI <0.03 0.21* 1.94* 4.46*   BNP (last 3 results) No results for input(s): PROBNP in the last 8760 hours. HbA1C: Recent Labs    02/20/18 1046  HGBA1C 6.0*   CBG: Recent Labs  Lab 02/21/18 1129 02/21/18 1652 02/21/18 2114 02/22/18 0733 02/22/18 1235  GLUCAP 190* 154* 143* 141* 150*   Lipid Profile: Recent Labs    02/20/18 1046  CHOL 201*  HDL 56  LDLCALC 118*  TRIG 135  CHOLHDL 3.6   Thyroid Function Tests: Recent Labs    02/20/18 1046  TSH 0.964   Anemia Panel: No results for input(s): VITAMINB12, FOLATE, FERRITIN, TIBC, IRON, RETICCTPCT in the last 72 hours. Urine analysis: No results found for: COLORURINE, APPEARANCEUR, LABSPEC, PHURINE, GLUCOSEU, HGBUR, BILIRUBINUR, KETONESUR, PROTEINUR, UROBILINOGEN, NITRITE, LEUKOCYTESUR No results found for this or any previous visit (from the past 240 hour(s)).    Radiology Studies: No results found.  Scheduled Meds: . amLODipine  5 mg Oral  Daily  . aspirin EC  325 mg Oral Daily  . clopidogrel  75 mg Oral Daily  . folic acid  1 mg Oral Daily  . insulin aspart  0-5 Units Subcutaneous QHS  . insulin aspart  0-9 Units Subcutaneous TID WC  . metoprolol tartrate  25 mg Oral BID  . multivitamin with minerals  1 tablet Oral Daily  . rosuvastatin  10 mg Oral q1800  . thiamine  100 mg Oral Daily   Or  . thiamine  100 mg Intravenous Daily   Continuous Infusions: . heparin 1,150 Units/hr (02/22/18 1027)     LOS: 2 days   Time spent: 25 minutes.  Tyrone Nine, MD [facility] [email or web address] Password [email or web address] 02/22/2018, 2:28 PM

## 2018-02-23 ENCOUNTER — Ambulatory Visit (HOSPITAL_COMMUNITY)
Admission: RE | Admit: 2018-02-23 | Payer: BLUE CROSS/BLUE SHIELD | Source: Ambulatory Visit | Admitting: Interventional Cardiology

## 2018-02-23 ENCOUNTER — Encounter (HOSPITAL_COMMUNITY)
Admission: EM | Disposition: A | Discharge status: Home / Self Care | Payer: Self-pay | Source: Home / Self Care | Attending: Cardiothoracic Surgery

## 2018-02-23 DIAGNOSIS — I214 Non-ST elevation (NSTEMI) myocardial infarction: Secondary | ICD-10-CM

## 2018-02-23 HISTORY — PX: LEFT HEART CATH AND CORONARY ANGIOGRAPHY: CATH118249

## 2018-02-23 LAB — GLUCOSE, CAPILLARY
GLUCOSE-CAPILLARY: 122 mg/dL — AB (ref 70–99)
GLUCOSE-CAPILLARY: 100 mg/dL — AB (ref 70–99)
Glucose-Capillary: 129 mg/dL — ABNORMAL HIGH (ref 70–99)
Glucose-Capillary: 179 mg/dL — ABNORMAL HIGH (ref 70–99)

## 2018-02-23 LAB — BASIC METABOLIC PANEL
Anion gap: 12 (ref 5–15)
BUN: 24 mg/dL — AB (ref 8–23)
CHLORIDE: 101 mmol/L (ref 98–111)
CO2: 27 mmol/L (ref 22–32)
CREATININE: 1.19 mg/dL (ref 0.61–1.24)
Calcium: 9.2 mg/dL (ref 8.9–10.3)
GFR calc Af Amer: >60 mL/min (ref >60)
GFR calc non Af Amer: 60 mL/min — ABNORMAL LOW (ref >60)
Glucose, Bld: 142 mg/dL — ABNORMAL HIGH (ref 70–99)
POTASSIUM: 3.6 mmol/L (ref 3.5–5.1)
SODIUM: 140 mmol/L (ref 135–145)

## 2018-02-23 LAB — CBC
HEMATOCRIT: 43.4 % (ref 39.0–52.0)
HEMOGLOBIN: 13.8 g/dL (ref 13.0–17.0)
MCH: 30.2 pg (ref 26.0–34.0)
MCHC: 31.8 g/dL (ref 30.0–36.0)
MCV: 95 fL (ref 78.0–100.0)
Platelets: 202 10*3/uL (ref 150–400)
RBC: 4.57 MIL/uL (ref 4.22–5.81)
RDW: 12.6 % (ref 11.5–15.5)
WBC: 7.5 10*3/uL (ref 4.0–10.5)

## 2018-02-23 LAB — HEPARIN LEVEL (UNFRACTIONATED): Heparin Unfractionated: 0.55 IU/mL (ref 0.30–0.70)

## 2018-02-23 LAB — SURGICAL PCR SCREEN
MRSA, PCR: NEGATIVE
Staphylococcus aureus: NEGATIVE

## 2018-02-23 SURGERY — LEFT HEART CATH AND CORONARY ANGIOGRAPHY
Anesthesia: LOCAL

## 2018-02-23 MED ORDER — FENTANYL CITRATE (PF) 100 MCG/2ML IJ SOLN
INTRAMUSCULAR | Status: DC | PRN
  Administered 2018-02-23: 25 ug via INTRAVENOUS

## 2018-02-23 MED ORDER — SODIUM CHLORIDE 0.9 % IV SOLN
INTRAVENOUS | Status: AC
  Administered 2018-02-23 (×2): via INTRAVENOUS

## 2018-02-23 MED ORDER — IOHEXOL 350 MG/ML SOLN
INTRAVENOUS | Status: DC | PRN
  Administered 2018-02-23: 95 mL

## 2018-02-23 MED ORDER — MIDAZOLAM HCL 2 MG/2ML IJ SOLN
INTRAMUSCULAR | Status: DC | PRN
  Administered 2018-02-23: 2 mg via INTRAVENOUS

## 2018-02-23 MED ORDER — SODIUM CHLORIDE 0.9% FLUSH
3.0000 mL | Freq: Two times a day (BID) | INTRAVENOUS | Status: DC
  Administered 2018-02-23 – 2018-02-25 (×5): 3 mL via INTRAVENOUS

## 2018-02-23 MED ORDER — SODIUM CHLORIDE 0.9% FLUSH: 3.0000 mL | INTRAVENOUS | Status: DC | PRN

## 2018-02-23 MED ORDER — HEPARIN SODIUM (PORCINE) 1000 UNIT/ML IJ SOLN
INTRAMUSCULAR | Status: DC | PRN
  Administered 2018-02-23: 4000 [IU] via INTRAVENOUS

## 2018-02-23 MED ORDER — LIDOCAINE HCL (PF) 1 % IJ SOLN
INTRAMUSCULAR | Status: DC | PRN
  Administered 2018-02-23: 2 mL

## 2018-02-23 MED ORDER — HEPARIN (PORCINE) IN NACL 1000-0.9 UT/500ML-% IV SOLN
INTRAVENOUS | Status: DC | PRN
  Administered 2018-02-23 (×2): 500 mL

## 2018-02-23 MED ORDER — MUPIROCIN 2 % EX OINT
1.0000 "application " | TOPICAL_OINTMENT | Freq: Two times a day (BID) | CUTANEOUS | Status: AC
  Administered 2018-02-23 – 2018-02-24 (×3): 1 via NASAL
  Filled 2018-02-23: qty 22

## 2018-02-23 MED ORDER — ASPIRIN 81 MG PO CHEW
81.0000 mg | CHEWABLE_TABLET | Freq: Every day | ORAL | Status: DC
  Administered 2018-02-24 – 2018-02-25 (×2): 81 mg via ORAL
  Filled 2018-02-23 (×2): qty 1

## 2018-02-23 MED ORDER — ACETAMINOPHEN 325 MG PO TABS
650.0000 mg | ORAL_TABLET | ORAL | Status: DC | PRN
  Administered 2018-02-24 – 2018-02-25 (×3): 650 mg via ORAL
  Filled 2018-02-23 (×3): qty 2

## 2018-02-23 MED ORDER — SODIUM CHLORIDE 0.9 % IV SOLN: 250.0000 mL | INTRAVENOUS | Status: DC | PRN

## 2018-02-23 MED ORDER — ONDANSETRON HCL 4 MG/2ML IJ SOLN: 4.0000 mg | Freq: Four times a day (QID) | INTRAMUSCULAR | Status: DC | PRN

## 2018-02-23 MED ORDER — HEPARIN (PORCINE) IN NACL 100-0.45 UNIT/ML-% IJ SOLN
1200.0000 [IU]/h | INTRAMUSCULAR | Status: DC
  Administered 2018-02-23: 1150 [IU]/h via INTRAVENOUS
  Administered 2018-02-24 – 2018-02-25 (×2): 1200 [IU]/h via INTRAVENOUS
  Filled 2018-02-23 (×3): qty 250

## 2018-02-23 MED ORDER — VERAPAMIL HCL 2.5 MG/ML IV SOLN
INTRAVENOUS | Status: DC | PRN
  Administered 2018-02-23: 10 mL via INTRA_ARTERIAL

## 2018-02-23 SURGICAL SUPPLY — 9 items
CATH 5FR JL3.5 JR4 ANG PIG MP (CATHETERS) ×2 IMPLANT
DEVICE RAD COMP TR BAND LRG (VASCULAR PRODUCTS) ×2 IMPLANT
GLIDESHEATH SLEND SS 6F .021 (SHEATH) ×2 IMPLANT
GUIDEWIRE INQWIRE 1.5J.035X260 (WIRE) ×1 IMPLANT
INQWIRE 1.5J .035X260CM (WIRE) ×2
KIT HEART LEFT (KITS) ×2 IMPLANT
PACK CARDIAC CATHETERIZATION (CUSTOM PROCEDURE TRAY) ×2 IMPLANT
TRANSDUCER W/STOPCOCK (MISCELLANEOUS) ×2 IMPLANT
TUBING CIL FLEX 10 FLL-RA (TUBING) ×2 IMPLANT

## 2018-02-23 NOTE — Progress Notes (Signed)
I am covering cardmaster role, Dr. Eldridge DaceVaranasi requests CVTS consult -Relayed to CVTS nurse coordinator to help facilitate consult. Trexton Escamilla PA-C

## 2018-02-23 NOTE — Progress Notes (Signed)
ANTICOAGULATION CONSULT NOTE - Follow Up Consult  Pharmacy Consult for Heparin Indication: NSTEMI  No Known Allergies  Patient Measurements: Height: 5\' 10"  (177.8 cm) Weight: 177 lb 8 oz (80.5 kg) IBW/kg (Calculated) : 73 Heparin Dosing Weight:  80.5 kg  Vital Signs: Temp: 97.7 F (36.5 C) (08/06 0416) Temp Source: Oral (08/06 0416) BP: 114/59 (08/06 0832) Pulse Rate: 70 (08/06 0832)  Labs: Recent Labs    02/20/18 1046 02/20/18 1615 02/20/18 2230  02/21/18 1952 02/22/18 0548 02/22/18 1441 02/23/18 0620  HGB  --   --   --   --   --  14.3  --  13.8  HCT  --   --   --   --   --  44.0  --  43.4  PLT  --   --   --   --   --  204  --  202  HEPARINUNFRC  --   --   --    < > 0.28* 0.27*  --  0.55  CREATININE  --   --   --   --   --   --  1.14 1.19  TROPONINI 0.21* 1.94* 4.46*  --   --   --   --   --    < > = values in this interval not displayed.    Estimated Creatinine Clearance: 58.8 mL/min (by C-G formula based on SCr of 1.19 mg/dL).  Assessment:  Anticoag: NSTEMI. H/o afib unclear. HL 0.55. CBC still WNL  Goal of Therapy:  Heparin level 0.3-0.7 units/ml Monitor platelets by anticoagulation protocol: Yes   Plan:  Continue IV heparin at 1150 units/hr Daily HL, CBC Agrees to cath but does not want any intervention Cath 8/6   Shane Yates Shane Yates, Shane Yates, Shane Yates Clinical Staff Pharmacist Pager [phone]  Shane Yates, Shane Yates 02/23/2018,8:42 AM

## 2018-02-23 NOTE — Progress Notes (Deleted)
CRITICAL VALUE ALERT  Critical Value:  Trop  Date & Time Notified:  02/23/2018 @ 1830  Provider Notified: NP Jeraldine LootsHammond  Orders Received/Actions taken: No further orders at this time. Continue with scheduled trop draws to determine peak.

## 2018-02-23 NOTE — Interval H&P Note (Signed)
Cath Lab Visit (complete for each Cath Lab visit)  Clinical Evaluation Leading to the Procedure:   ACS: Yes.    Non-ACS:    Anginal Classification: CCS IV  Anti-ischemic medical therapy: Minimal Therapy (1 class of medications)  Non-Invasive Test Results: No non-invasive testing performed  Prior CABG: No previous CABG      History and Physical Interval Note:  02/23/2018 1:38 PM  Shane Yates  has presented today for surgery, with the diagnosis of cp  The various methods of treatment have been discussed with the patient and family. After consideration of risks, benefits and other options for treatment, the patient has consented to  Procedure(s): LEFT HEART CATH AND CORONARY ANGIOGRAPHY (N/A) as a surgical intervention .  The patient's history has been reviewed, patient examined, no change in status, stable for surgery.  I have reviewed the patient's chart and labs.  Questions were answered to the patient's satisfaction.     Lance MussJayadeep Latayvia Mandujano

## 2018-02-23 NOTE — Progress Notes (Signed)
Progress Note  Patient Name: Shane Yates Date of Encounter: 02/23/2018  Primary Cardiologist:  New to [facility]   Subjective   72 year old gentleman from [place].  He speaks primarily Hindi.  We have communicated to him via his son who acts as interpreter and also the interpreter software program.  Patient has had episodes of chest pain for several years.  He was told that he needed a cath while in [place] several years ago.  He refused cath at that time.  He now presents with a non-ST segment elevation myocardial infarction.  Troponin levels are 4.46.  He is scheduled for heart catheterization today.  Inpatient Medications    Scheduled Meds: . amLODipine  5 mg Oral Daily  . aspirin EC  325 mg Oral Daily  . clopidogrel  75 mg Oral Daily  . folic acid  1 mg Oral Daily  . insulin aspart  0-5 Units Subcutaneous QHS  . insulin aspart  0-9 Units Subcutaneous TID WC  . metoprolol tartrate  25 mg Oral BID  . multivitamin with minerals  1 tablet Oral Daily  . rosuvastatin  10 mg Oral q1800  . sodium chloride flush  3 mL Intravenous Q12H  . thiamine  100 mg Oral Daily   Or  . thiamine  100 mg Intravenous Daily   Continuous Infusions: . sodium chloride    . sodium chloride 50 mL/hr at 02/23/18 0456  . heparin 1,150 Units/hr (02/22/18 1940)   PRN Meds: sodium chloride, acetaminophen, gi cocktail, LORazepam **OR** LORazepam, morphine injection, nitroGLYCERIN, ondansetron (ZOFRAN) IV, sodium chloride flush   Vital Signs    Vitals:   02/22/18 1241 02/22/18 2037 02/23/18 0416 02/23/18 0832  BP: 116/76 125/68 106/66 (!) 114/59  Pulse: 70 78 66 70  Resp:  18 18   Temp: 98 F (36.7 C) 98.1 F (36.7 C) 97.7 F (36.5 C)   TempSrc: Oral Oral Oral   SpO2: 96% 97% 96% 99%  Weight:   177 lb 8 oz (80.5 kg)   Height:        Intake/Output Summary (Last 24 hours) at 02/23/2018 0854 Last data filed at 02/23/2018 0520 Gross per 24 hour  Intake 268.66 ml  Output -  Net 268.66 ml   Filed Weights    02/21/18 0640 02/22/18 0348 02/23/18 0416  Weight: 175 lb (79.4 kg) 177 lb 8 oz (80.5 kg) 177 lb 8 oz (80.5 kg)    Telemetry    NSR  - Personally Reviewed  ECG     NSR  - Personally Reviewed  Physical Exam   GEN:  Elderly gentleman, no acute distress. Neck: No JVD Cardiac: RRR, no murmurs, rubs, or gallops.  Respiratory: Clear to auscultation bilaterally. GI: Soft, nontender, non-distended  MS: No edema; No deformity.  Radial pulses strong.  Neuro:  Nonfocal  Psych: Normal affect   Labs    Chemistry Recent Labs  Lab 02/20/18 0628 02/22/18 1441 02/23/18 0620  NA 138 139 140  K 3.6 3.5 3.6  CL 101 103 101  CO2 28 26 27   GLUCOSE 153* 195* 142*  BUN 13 18 24*  CREATININE 0.83 1.14 1.19  CALCIUM 9.1 9.0 9.2  PROT 7.1  --   --   ALBUMIN 4.2  --   --   AST 22  --   --   ALT 17  --   --   ALKPHOS 69  --   --   BILITOT 1.0  --   --  GFRNONAA >60 >60 60*  GFRAA >60 >60 >60  ANIONGAP 9 10 12      Hematology Recent Labs  Lab 02/20/18 0628 02/22/18 0548 02/23/18 0620  WBC 7.4 8.6 7.5  RBC 4.69 4.76 4.57  HGB 14.6 14.3 13.8  HCT 42.8 44.0 43.4  MCV 91.3 92.4 95.0  MCH 31.1 30.0 30.2  MCHC 34.1 32.5 31.8  RDW 12.8 12.6 12.6  PLT 192 204 202    Cardiac Enzymes Recent Labs  Lab 02/20/18 0628 02/20/18 1046 02/20/18 1615 02/20/18 2230  TROPONINI <0.03 0.21* 1.94* 4.46*   No results for input(s): TROPIPOC in the last 168 hours.   BNPNo results for input(s): BNP, PROBNP in the last 168 hours.   DDimer No results for input(s): DDIMER in the last 168 hours.   Radiology    No results found.  Cardiac Studies     Patient Profile     72 y.o. male admitted with non-ST segment elevation myocardial infarction.  He is currently pain-free.  He is scheduled for heart catheterization later today.  Assessment & Plan    1.  Coronary artery disease: The patient will have a heart catheter station today.  I discussed the options of stent placement versus a  coronary artery bypass surgery versus medical therapy.  I told the patient and the son with that we will know more after the heart  Our PA Vin Bhagat has offered to assist in talking to the patient after the cath to help explain the findings.   IN addition,  I anticipate having the patinet follow up with vin and me when he comes to the office.   2.  Hyperlipidemia:   We change the atorvastatin to rosuvastatin 10 mg a day.    For questions or updates, please contact [facility] Please consult [email or web address] for contact info under Cardiology/STEMI.      Signed, Kristeen Miss, MD  02/23/2018, 8:54 AM

## 2018-02-23 NOTE — H&P (View-Only) (Signed)
 Progress Note  Patient Name: Shane Yates Date of Encounter: 02/23/2018  Primary Cardiologist:  New to [facility]   Subjective   72-year-old gentleman from [place].  He speaks primarily Hindi.  We have communicated to him via his son who acts as interpreter and also the interpreter software program.  Patient has had episodes of chest pain for several years.  He was told that he needed a cath while in [place] several years ago.  He refused cath at that time.  He now presents with a non-ST segment elevation myocardial infarction.  Troponin levels are 4.46.  He is scheduled for heart catheterization today.  Inpatient Medications    Scheduled Meds: . amLODipine  5 mg Oral Daily  . aspirin EC  325 mg Oral Daily  . clopidogrel  75 mg Oral Daily  . folic acid  1 mg Oral Daily  . insulin aspart  0-5 Units Subcutaneous QHS  . insulin aspart  0-9 Units Subcutaneous TID WC  . metoprolol tartrate  25 mg Oral BID  . multivitamin with minerals  1 tablet Oral Daily  . rosuvastatin  10 mg Oral q1800  . sodium chloride flush  3 mL Intravenous Q12H  . thiamine  100 mg Oral Daily   Or  . thiamine  100 mg Intravenous Daily   Continuous Infusions: . sodium chloride    . sodium chloride 50 mL/hr at 02/23/18 0456  . heparin 1,150 Units/hr (02/22/18 1940)   PRN Meds: sodium chloride, acetaminophen, gi cocktail, LORazepam **OR** LORazepam, morphine injection, nitroGLYCERIN, ondansetron (ZOFRAN) IV, sodium chloride flush   Vital Signs    Vitals:   02/22/18 1241 02/22/18 2037 02/23/18 0416 02/23/18 0832  BP: 116/76 125/68 106/66 (!) 114/59  Pulse: 70 78 66 70  Resp:  18 18   Temp: 98 F (36.7 C) 98.1 F (36.7 C) 97.7 F (36.5 C)   TempSrc: Oral Oral Oral   SpO2: 96% 97% 96% 99%  Weight:   177 lb 8 oz (80.5 kg)   Height:        Intake/Output Summary (Last 24 hours) at 02/23/2018 0854 Last data filed at 02/23/2018 0520 Gross per 24 hour  Intake 268.66 ml  Output -  Net 268.66 ml   Filed Weights    02/21/18 0640 02/22/18 0348 02/23/18 0416  Weight: 175 lb (79.4 kg) 177 lb 8 oz (80.5 kg) 177 lb 8 oz (80.5 kg)    Telemetry    NSR  - Personally Reviewed  ECG     NSR  - Personally Reviewed  Physical Exam   GEN:  Elderly gentleman, no acute distress. Neck: No JVD Cardiac: RRR, no murmurs, rubs, or gallops.  Respiratory: Clear to auscultation bilaterally. GI: Soft, nontender, non-distended  MS: No edema; No deformity.  Radial pulses strong.  Neuro:  Nonfocal  Psych: Normal affect   Labs    Chemistry Recent Labs  Lab 02/20/18 0628 02/22/18 1441 02/23/18 0620  NA 138 139 140  K 3.6 3.5 3.6  CL 101 103 101  CO2 28 26 27  GLUCOSE 153* 195* 142*  BUN 13 18 24*  CREATININE 0.83 1.14 1.19  CALCIUM 9.1 9.0 9.2  PROT 7.1  --   --   ALBUMIN 4.2  --   --   AST 22  --   --   ALT 17  --   --   ALKPHOS 69  --   --   BILITOT 1.0  --   --     GFRNONAA >60 >60 60*  GFRAA >60 >60 >60  ANIONGAP 9 10 12     Hematology Recent Labs  Lab 02/20/18 0628 02/22/18 0548 02/23/18 0620  WBC 7.4 8.6 7.5  RBC 4.69 4.76 4.57  HGB 14.6 14.3 13.8  HCT 42.8 44.0 43.4  MCV 91.3 92.4 95.0  MCH 31.1 30.0 30.2  MCHC 34.1 32.5 31.8  RDW 12.8 12.6 12.6  PLT 192 204 202    Cardiac Enzymes Recent Labs  Lab 02/20/18 0628 02/20/18 1046 02/20/18 1615 02/20/18 2230  TROPONINI <0.03 0.21* 1.94* 4.46*   No results for input(s): TROPIPOC in the last 168 hours.   BNPNo results for input(s): BNP, PROBNP in the last 168 hours.   DDimer No results for input(s): DDIMER in the last 168 hours.   Radiology    No results found.  Cardiac Studies     Patient Profile     72 y.o. male admitted with non-ST segment elevation myocardial infarction.  He is currently pain-free.  He is scheduled for heart catheterization later today.  Assessment & Plan    1.  Coronary artery disease: The patient will have a heart catheter station today.  I discussed the options of stent placement versus a  coronary artery bypass surgery versus medical therapy.  I told the patient and the son with that we will know more after the heart  Our PA Vin Bhagat has offered to assist in talking to the patient after the cath to help explain the findings.   IN addition,  I anticipate having the patinet follow up with vin and me when he comes to the office.   2.  Hyperlipidemia:   We change the atorvastatin to rosuvastatin 10 mg a day.    For questions or updates, please contact [facility] Please consult [email or web address] for contact info under Cardiology/STEMI.      Signed, Aryaa Bunting, MD  02/23/2018, 8:54 AM    

## 2018-02-23 NOTE — Progress Notes (Signed)
ANTICOAGULATION CONSULT NOTE - Follow Up Consult  Pharmacy Consult for Heparin Indication: CAD pending TCTS eval  No Known Allergies  Patient Measurements: Height: 5\' 10"  (177.8 cm) Weight: 179 lb 10.8 oz (81.5 kg) IBW/kg (Calculated) : 73 Heparin Dosing Weight: 81.5 kg  Vital Signs: Temp: 98.2 F (36.8 C) (08/06 1434) Temp Source: Oral (08/06 1434) BP: 129/83 (08/06 1515) Pulse Rate: 63 (08/06 1515)  Labs: Recent Labs    02/20/18 2230  02/21/18 1952 02/22/18 0548 02/22/18 1441 02/23/18 0620  HGB  --   --   --  14.3  --  13.8  HCT  --   --   --  44.0  --  43.4  PLT  --   --   --  204  --  202  HEPARINUNFRC  --    < > 0.28* 0.27*  --  0.55  CREATININE  --   --   --   --  1.14 1.19  TROPONINI 4.46*  --   --   --   --   --    < > = values in this interval not displayed.    Estimated Creatinine Clearance: 58.8 mL/min (by C-G formula based on SCr of 1.19 mg/dL).   Medications:  Scheduled:  . amLODipine  5 mg Oral Daily  . [START ON 02/24/2018] aspirin  81 mg Oral Daily  . folic acid  1 mg Oral Daily  . insulin aspart  0-5 Units Subcutaneous QHS  . insulin aspart  0-9 Units Subcutaneous TID WC  . metoprolol tartrate  25 mg Oral BID  . multivitamin with minerals  1 tablet Oral Daily  . mupirocin ointment  1 application Nasal BID  . rosuvastatin  10 mg Oral q1800  . sodium chloride flush  3 mL Intravenous Q12H  . thiamine  100 mg Oral Daily   Or  . thiamine  100 mg Intravenous Daily   Infusions:  . sodium chloride 100 mL/hr at 02/23/18 1439  . sodium chloride    . heparin 1,150 Units/hr (02/22/18 1940)    Assessment: 72 yo M s/p cath to restart on heparin for multivessel CAD pending TCTS eval for bypass surgery.  Heparin was previously therapeutic at 1150 units/hr.  Per MD orders, to restart heparin 8 hours after sheath pull which was at 1410 today.  TR band placed.  Goal of Therapy:  Heparin level 0.3-0.7 units/ml Monitor platelets by anticoagulation  protocol: Yes   Plan:  At 2200, restart heparin at 1150 units/hr. Next heparin level with AM labs  Toys 'R' UsKimberly Kebrina Friend, Pharm.D., BCPS Clinical Pharmacist Pager: [phone] Clinical phone for 02/23/2018 is [phone].  **Pharmacist phone directory can now be found on [email or web address] ([email or web address]).  [facility] under [facility].  02/23/2018 4:30 PM

## 2018-02-24 ENCOUNTER — Encounter (HOSPITAL_COMMUNITY): Payer: Self-pay | Admitting: Interventional Cardiology

## 2018-02-24 ENCOUNTER — Other Ambulatory Visit: Payer: Self-pay | Admitting: *Deleted

## 2018-02-24 ENCOUNTER — Encounter (HOSPITAL_COMMUNITY): Payer: BLUE CROSS/BLUE SHIELD

## 2018-02-24 ENCOUNTER — Inpatient Hospital Stay (HOSPITAL_COMMUNITY): Payer: BLUE CROSS/BLUE SHIELD

## 2018-02-24 DIAGNOSIS — Z01810 Encounter for preprocedural cardiovascular examination: Secondary | ICD-10-CM

## 2018-02-24 DIAGNOSIS — I10 Essential (primary) hypertension: Secondary | ICD-10-CM

## 2018-02-24 DIAGNOSIS — I2510 Atherosclerotic heart disease of native coronary artery without angina pectoris: Secondary | ICD-10-CM

## 2018-02-24 DIAGNOSIS — I25110 Atherosclerotic heart disease of native coronary artery with unstable angina pectoris: Secondary | ICD-10-CM

## 2018-02-24 DIAGNOSIS — I214 Non-ST elevation (NSTEMI) myocardial infarction: Secondary | ICD-10-CM

## 2018-02-24 LAB — CBC
HCT: 39.6 % (ref 39.0–52.0)
Hemoglobin: 12.7 g/dL — ABNORMAL LOW (ref 13.0–17.0)
MCH: 30.2 pg (ref 26.0–34.0)
MCHC: 32.1 g/dL (ref 30.0–36.0)
MCV: 94.3 fL (ref 78.0–100.0)
Platelets: 188 10*3/uL (ref 150–400)
RBC: 4.2 MIL/uL — ABNORMAL LOW (ref 4.22–5.81)
RDW: 12.7 % (ref 11.5–15.5)
WBC: 7.3 10*3/uL (ref 4.0–10.5)

## 2018-02-24 LAB — GLUCOSE, CAPILLARY
GLUCOSE-CAPILLARY: 109 mg/dL — AB (ref 70–99)
GLUCOSE-CAPILLARY: 145 mg/dL — AB (ref 70–99)
Glucose-Capillary: 124 mg/dL — ABNORMAL HIGH (ref 70–99)
Glucose-Capillary: 228 mg/dL — ABNORMAL HIGH (ref 70–99)

## 2018-02-24 LAB — HEPARIN LEVEL (UNFRACTIONATED)
HEPARIN UNFRACTIONATED: 0.39 [IU]/mL (ref 0.30–0.70)
HEPARIN UNFRACTIONATED: 0.52 [IU]/mL (ref 0.30–0.70)
Heparin Unfractionated: 0.19 IU/mL — ABNORMAL LOW (ref 0.30–0.70)

## 2018-02-24 LAB — PLATELET INHIBITION P2Y12: Platelet Function  P2Y12: 273 [PRU] (ref 194–418)

## 2018-02-24 MED ORDER — ROSUVASTATIN CALCIUM 20 MG PO TABS
20.0000 mg | ORAL_TABLET | Freq: Every day | ORAL | Status: DC
  Administered 2018-02-24 – 2018-02-25 (×2): 20 mg via ORAL
  Filled 2018-02-24 (×2): qty 1

## 2018-02-24 NOTE — Progress Notes (Signed)
Pre-op Cardiac Surgery  Carotid Findings:  1-39% stenosis bilateral ICAs.  Bilateral vertebral arteries antegrade flow.   Upper Extremity Right Left  Brachial Pressures 91 Unable to obtain due to IV on site  Radial Waveforms Triphasic Triphasic  Ulnar Waveforms Triphasic Biphasic  Palmar Arch (Allen's Test) WNL  WNL    Lower  Extremity Right Left  Dorsalis Pedis Triphasic Triphasic  Posterior Tibial Biphasic Triphasic  Ankle/Brachial Indices 1.68 1.71   Hongying Julyan Gales (RDMS RVT) 02/24/18 5:29 PM

## 2018-02-24 NOTE — Progress Notes (Signed)
ANTICOAGULATION CONSULT NOTE - Follow Up Consult  Pharmacy Consult for Heparin Indication: NSTEMI  No Known Allergies  Patient Measurements: Height: 5\' 10"  (177.8 cm) Weight: 179 lb 10.8 oz (81.5 kg) IBW/kg (Calculated) : 73 Heparin Dosing Weight: 81.5 kg  Vital Signs: Temp: 98.4 F (36.9 C) (08/07 2038) Temp Source: Oral (08/07 2038) BP: 114/77 (08/07 2100) Pulse Rate: 65 (08/07 2100)  Labs: Recent Labs    02/22/18 0548 02/22/18 1441 02/23/18 0620 02/24/18 0219 02/24/18 1225 02/24/18 2052  HGB 14.3  --  13.8 12.7*  --   --   HCT 44.0  --  43.4 39.6  --   --   PLT 204  --  202 188  --   --   HEPARINUNFRC 0.27*  --  0.55 0.19* 0.39 0.52  CREATININE  --  1.14 1.19  --   --   --     Estimated Creatinine Clearance: 58.8 mL/min (by C-G formula based on SCr of 1.19 mg/dL).   Medications:  Scheduled:  . amLODipine  5 mg Oral Daily  . aspirin  81 mg Oral Daily  . folic acid  1 mg Oral Daily  . insulin aspart  0-5 Units Subcutaneous QHS  . insulin aspart  0-9 Units Subcutaneous TID WC  . metoprolol tartrate  25 mg Oral BID  . multivitamin with minerals  1 tablet Oral Daily  . mupirocin ointment  1 application Nasal BID  . rosuvastatin  20 mg Oral q1800  . sodium chloride flush  3 mL Intravenous Q12H  . thiamine  100 mg Oral Daily   Or  . thiamine  100 mg Intravenous Daily   Infusions:  . sodium chloride    . heparin 1,200 Units/hr (02/24/18 2100)    Assessment: 72 yo M s/p cath resumed on heparin for multivessel CAD. CABG scheduled for 02/27/18.   Heparin level therapeutic at 0.52 - will continue same dose of 1200 units .   Goal of Therapy:  Heparin level 0.3-0.7 units/ml Monitor platelets by anticoagulation protocol: Yes   Plan:  Continue heparin 1200 units/hr Monitor daily heparin level and CBC, s/sx bleeding   Jeanella Caraathy Orlando Devereux, PharmD, [facility] Clinical Pharmacist Please see AMION for all Pharmacists' Contact Phone Numbers 02/24/2018, 10:12 PM

## 2018-02-24 NOTE — Progress Notes (Addendum)
ANTICOAGULATION CONSULT NOTE - Follow Up Consult  Pharmacy Consult for Heparin Indication: CAD pending TCTS eval  No Known Allergies  Patient Measurements: Height: 5\' 10"  (177.8 cm) Weight: 179 lb 10.8 oz (81.5 kg) IBW/kg (Calculated) : 73 Heparin Dosing Weight: 81.5 kg  Vital Signs: Temp: 97.8 F (36.6 C) (08/06 2031) Temp Source: Oral (08/06 2031) BP: 129/78 (08/07 0200) Pulse Rate: 73 (08/07 0200)  Labs: Recent Labs    02/22/18 0548 02/22/18 1441 02/23/18 0620 02/24/18 0219  HGB 14.3  --  13.8 12.7*  HCT 44.0  --  43.4 39.6  PLT 204  --  202 188  HEPARINUNFRC 0.27*  --  0.55 0.19*  CREATININE  --  1.14 1.19  --     Estimated Creatinine Clearance: 58.8 mL/min (by C-G formula based on SCr of 1.19 mg/dL).   Medications:  Scheduled:  . amLODipine  5 mg Oral Daily  . aspirin  81 mg Oral Daily  . folic acid  1 mg Oral Daily  . insulin aspart  0-5 Units Subcutaneous QHS  . insulin aspart  0-9 Units Subcutaneous TID WC  . metoprolol tartrate  25 mg Oral BID  . multivitamin with minerals  1 tablet Oral Daily  . mupirocin ointment  1 application Nasal BID  . rosuvastatin  10 mg Oral q1800  . sodium chloride flush  3 mL Intravenous Q12H  . thiamine  100 mg Oral Daily   Or  . thiamine  100 mg Intravenous Daily   Infusions:  . sodium chloride    . heparin 1,150 Units/hr (02/24/18 0200)    Assessment: 72 yo M s/p cath resumed on heparin for multivessel CAD pending TCTS eval for bypass surgery.   Heparin level low at 0.19, drawn early at 4.5 hrs - will conservatively increase and recheck level given previously therapeutic at this rate. Hg down to 12.7, plt wnl. No bleed or issues with infusion per discussion with RN.  Goal of Therapy:  Heparin level 0.3-0.7 units/ml Monitor platelets by anticoagulation protocol: Yes   Plan:  Increase heparin to 1200 units/hr 8h heparin level Monitor daily heparin level and CBC, s/sx bleeding F/u CVTS plans  Babs BertinHaley Venera Privott,  PharmD, BCPS Clinical Pharmacist 02/24/2018 3:45 AM

## 2018-02-24 NOTE — Plan of Care (Signed)
Pt currently in bed, VAS US doppler pre-cabg being done. CABG scheduled for 02/26/18. Pt family at bedside.  Pt complained of soreness on left palm during shift, heat pack and tylenol given, no other complaints reported. Pt able to ambulate to from the bathroom, as well as to and from the chair. Pt ambulated with son this shift, walked 37250ft. Pt vital signs are stable. No new changes at this time. Will continue to monitor.  Problem: Education: Goal: Knowledge of General Education information will improve Description Including pain rating scale, medication(s)/side effects and non-pharmacologic comfort measures Outcome: Progressing   Problem: Health Behavior/Discharge Planning: Goal: Ability to manage health-related needs will improve Outcome: Progressing   Problem: Clinical Measurements: Goal: Ability to maintain clinical measurements within normal limits will improve Outcome: Progressing Goal: Will remain free from infection Outcome: Progressing Goal: Diagnostic test results will improve Outcome: Progressing Goal: Respiratory complications will improve Outcome: Progressing Goal: Cardiovascular complication will be avoided Outcome: Progressing   Problem: Activity: Goal: Risk for activity intolerance will decrease Outcome: Progressing   Problem: Coping: Goal: Level of anxiety will decrease Outcome: Progressing   Problem: Elimination: Goal: Will not experience complications related to urinary retention Outcome: Progressing   Problem: Skin Integrity: Goal: Risk for impaired skin integrity will decrease Outcome: Progressing

## 2018-02-24 NOTE — Progress Notes (Deleted)
ANTICOAGULATION CONSULT NOTE - Follow Up Consult  Pharmacy Consult for Heparin Indication: NSTEMI  No Known Allergies  Patient Measurements: Height: 5\' 10"  (177.8 cm) Weight: 179 lb 10.8 oz (81.5 kg) IBW/kg (Calculated) : 73 Heparin Dosing Weight: 81.5 kg  Vital Signs: Temp: 98.3 F (36.8 C) (08/07 1100) Temp Source: Oral (08/07 1100) BP: 137/73 (08/07 1200) Pulse Rate: 69 (08/07 1200)  Labs: Recent Labs    02/22/18 0548 02/22/18 1441 02/23/18 0620 02/24/18 0219 02/24/18 1225  HGB 14.3  --  13.8 12.7*  --   HCT 44.0  --  43.4 39.6  --   PLT 204  --  202 188  --   HEPARINUNFRC 0.27*  --  0.55 0.19* 0.39  CREATININE  --  1.14 1.19  --   --     Estimated Creatinine Clearance: 58.8 mL/min (by C-G formula based on SCr of 1.19 mg/dL).   Medications:  Scheduled:  . amLODipine  5 mg Oral Daily  . aspirin  81 mg Oral Daily  . folic acid  1 mg Oral Daily  . insulin aspart  0-5 Units Subcutaneous QHS  . insulin aspart  0-9 Units Subcutaneous TID WC  . metoprolol tartrate  25 mg Oral BID  . multivitamin with minerals  1 tablet Oral Daily  . mupirocin ointment  1 application Nasal BID  . rosuvastatin  20 mg Oral q1800  . sodium chloride flush  3 mL Intravenous Q12H  . thiamine  100 mg Oral Daily   Or  . thiamine  100 mg Intravenous Daily   Infusions:  . sodium chloride    . heparin 1,200 Units/hr (02/24/18 1200)    Assessment: 72 yo M s/p cath resumed on heparin for multivessel CAD pending bypass surgery. Surgery is scheduled for 8/10.  Heparin level therapeutic at 0.39 on 1200 units/hr. Hb down to 12.7, plt wnl. No bleeding or issues with infusion per discussion with RN.  Goal of Therapy:  Heparin level 0.3-0.7 units/ml Monitor platelets by anticoagulation protocol: Yes   Plan:  Continue heparin at 1200 units/hr Monitor daily heparin level and CBC, s/sx bleeding   Marcelino FreestoneEmily Chandelle Harkey, PharmD PGY2 Cardiology Pharmacy Resident Phone [phone] 02/24/2018  1:11 PM

## 2018-02-24 NOTE — Progress Notes (Signed)
Progress Note  Patient Name: Shane Yates Date of Encounter: 02/24/2018  Primary Cardiologist:  New to [facility]   Subjective   72 year old gentleman from [place].  He speaks primarily Hindi.  We have communicated to him via his son who acts as interpreter  Heart catheterization yesterday reveals three-vessel coronary artery disease.  TCTS  has been consulted for the possibility of coronary artery bypass grafting.  He is feeling well.  Is not having any angina.  Inpatient Medications    Scheduled Meds: . amLODipine  5 mg Oral Daily  . aspirin  81 mg Oral Daily  . folic acid  1 mg Oral Daily  . insulin aspart  0-5 Units Subcutaneous QHS  . insulin aspart  0-9 Units Subcutaneous TID WC  . metoprolol tartrate  25 mg Oral BID  . multivitamin with minerals  1 tablet Oral Daily  . mupirocin ointment  1 application Nasal BID  . rosuvastatin  10 mg Oral q1800  . sodium chloride flush  3 mL Intravenous Q12H  . thiamine  100 mg Oral Daily   Or  . thiamine  100 mg Intravenous Daily   Continuous Infusions: . sodium chloride    . heparin 1,200 Units/hr (02/24/18 0900)   PRN Meds: sodium chloride, acetaminophen, gi cocktail, morphine injection, nitroGLYCERIN, ondansetron (ZOFRAN) IV, sodium chloride flush   Vital Signs    Vitals:   02/24/18 0700 02/24/18 0800 02/24/18 0819 02/24/18 0900  BP: (!) 155/83 137/89 137/89 132/89  Pulse: 74 73 83   Resp: (!) 25 12 12  (!) 22  Temp:  98.2 F (36.8 C)    TempSrc:  Oral    SpO2: 93% 96% 96%   Weight:      Height:        Intake/Output Summary (Last 24 hours) at 02/24/2018 0916 Last data filed at 02/24/2018 0900 Gross per 24 hour  Intake 912.99 ml  Output 300 ml  Net 612.99 ml   Filed Weights   02/22/18 0348 02/23/18 0416 02/23/18 1434  Weight: 177 lb 8 oz (80.5 kg) 177 lb 8 oz (80.5 kg) 179 lb 10.8 oz (81.5 kg)    Telemetry    NSR  - Personally Reviewed  ECG     NSR  - Personally Reviewed  Physical Exam   GEN:  Elderly  gentleman, no acute distress.   Neck: No JVD Cardiac: RRR, no murmurs, rubs, or gallops.  Respiratory: Clear to auscultation bilaterally. GI: Soft, nontender, non-distended  MS:  Right radial cath site looks good.. Neuro:  Nonfocal  Psych: Normal affect   Labs    Chemistry Recent Labs  Lab 02/20/18 0628 02/22/18 1441 02/23/18 0620  NA 138 139 140  K 3.6 3.5 3.6  CL 101 103 101  CO2 28 26 27   GLUCOSE 153* 195* 142*  BUN 13 18 24*  CREATININE 0.83 1.14 1.19  CALCIUM 9.1 9.0 9.2  PROT 7.1  --   --   ALBUMIN 4.2  --   --   AST 22  --   --   ALT 17  --   --   ALKPHOS 69  --   --   BILITOT 1.0  --   --   GFRNONAA >60 >60 60*  GFRAA >60 >60 >60  ANIONGAP 9 10 12      Hematology Recent Labs  Lab 02/22/18 0548 02/23/18 0620 02/24/18 0219  WBC 8.6 7.5 7.3  RBC 4.76 4.57 4.20*  HGB 14.3 13.8 12.7*  HCT 44.0  43.4 39.6  MCV 92.4 95.0 94.3  MCH 30.0 30.2 30.2  MCHC 32.5 31.8 32.1  RDW 12.6 12.6 12.7  PLT 204 202 188    Cardiac Enzymes Recent Labs  Lab 02/20/18 0628 02/20/18 1046 02/20/18 1615 02/20/18 2230  TROPONINI <0.03 0.21* 1.94* 4.46*   No results for input(s): TROPIPOC in the last 168 hours.   BNPNo results for input(s): BNP, PROBNP in the last 168 hours.   DDimer No results for input(s): DDIMER in the last 168 hours.   Radiology    No results found.  Cardiac Studies     Patient Profile     72 y.o. male admitted with non-ST segment elevation myocardial infarction.  Assessment & Plan    1.  Coronary artery disease: The patient was found to have three-vessel coronary artery disease by heart catheterization yesterday.  We have consulted the surgical team.  Gust the situation with the family and the patient.  Will await surgical consultation.  2.  Hyperlipidemia: Continue rosuvastatin 10 mg a day  For questions or updates, please contact [facility] Please consult [email or web address] for contact info under Cardiology/STEMI.       Signed, Kristeen MissPhilip Maythe Deramo, MD  02/24/2018, 9:16 AM

## 2018-02-24 NOTE — Consult Note (Signed)
[address]       [place] [address]             [phone]        Antonis Lor [facility] Medical Record #[id number] Date of Birth: Apr 14, 1946  Referring: Dr Eldridge Dace Primary Care: Patient, No Pcp Per Primary Cardiologist:No primary cardiologist provider on file.  Chief Complaint:    Chief Complaint  Patient presents with  . Chest Pain    History of Present Illness:     72 year old Hindu speaking male from [place] with medical history significantcad, afib vs  Pac on admission ,  on plavix every 3 days, htn, diabetes,  non-compliance etoh abuse was admitted on August 3 to the hospitalist service from [facility] for chest pain rule out. In  the ED, ECG showed irregular tachycardia. Troponin was initially negative.  Since admission, his troponin has risen to 4.4.  Cardiology was consulted and cardiac catheterization done yesterday.  The patient's compliance with Plavix is unknown, P2 Y 12 testing today is 273.  The patient notes that he had similar episode proximally 5 years ago while in [place], intervention was recommended that time but he was distrustful of the physicians and declined further treatment.  Patient's history is obtained with the help of medical interpreter Sherian Maroon and the patient's son who was present.   The patient has known history of coronary occlusive disease having had  coronary angiography in [place] about 5 years ago. At that time he was told he needed stents but the patient refused. I  Patient's had a previous stroke that primarily affected his left face several years ago while in [place], exact evaluation of this is unknown.  He notes no further recurrence denies amaurosis or TIAs currently.  Current Activity/ Functional Status: Patient is independent with mobility/ambulation, transfers, ADL's, IADL's.   Zubrod Score: At the time of surgery this patient's most appropriate activity status/level should be described as: []     0     Normal activity, no symptoms [x]     1    Restricted in physical strenuous activity but ambulatory, able to do out light work []     2    Ambulatory and capable of self care, unable to do work activities, up and about                 more than 50%  Of the time                            []     3    Only limited self care, in bed greater than 50% of waking hours []     4    Completely disabled, no self care, confined to bed or chair []     5    Moribund  Past Medical History:  Diagnosis Date  . Atrial fibrillation with RVR (HCC)   . Chest pain   . Coronary artery disease   . Diabetes mellitus without complication (HCC)   . EtOH dependence (HCC)   . Hyperlipidemia   . Hypertension   . MI (myocardial infarction) [facility])     Past Surgical History:  Procedure Laterality Date  . APPENDECTOMY    . LEFT HEART CATH AND CORONARY ANGIOGRAPHY N/A 02/23/2018   Procedure: LEFT HEART CATH AND CORONARY ANGIOGRAPHY;  Surgeon: Corky Crafts, MD;  Location: [facility] INVASIVE CV LAB;  Service: Cardiovascular;  Laterality:  N/A;    Social History   Tobacco Use  Smoking Status Never Smoker  Smokeless Tobacco Never Used    Social History   Substance and Sexual Activity  Alcohol Use Yes   Comment: Per son drinks nightly about of liquor, last drink last night 02/19/18     No Known Allergies  Current Facility-Administered Medications  Medication Dose Route Frequency Provider Last Rate Last Dose  . 0.9 %  sodium chloride infusion  250 mL Intravenous PRN Corky Crafts, MD      . acetaminophen (TYLENOL) tablet 650 mg  650 mg Oral Q4H PRN Corky Crafts, MD      . amLODipine (NORVASC) tablet 5 mg  5 mg Oral Daily Corky Crafts, MD   5 mg at 02/23/18 1610  . aspirin chewable tablet 81 mg  81 mg Oral Daily Corky Crafts, MD      . folic acid (FOLVITE) tablet 1 mg  1 mg Oral Daily Corky Crafts, MD   1 mg at 02/23/18 0834  . gi cocktail (Maalox,Lidocaine,Donnatal)  30 mL  Oral QID PRN Corky Crafts, MD      . heparin ADULT infusion 100 units/mL (25000 units/231mL sodium chloride 0.45%)  1,200 Units/hr Intravenous Continuous Almon Hercules, RPH 12 mL/hr at 02/24/18 0900 1,200 Units/hr at 02/24/18 0900  . insulin aspart (novoLOG) injection 0-5 Units  0-5 Units Subcutaneous QHS Lance Muss S, MD      . insulin aspart (novoLOG) injection 0-9 Units  0-9 Units Subcutaneous TID WC Corky Crafts, MD   1 Units at 02/24/18 [phone]  . metoprolol tartrate (LOPRESSOR) tablet 25 mg  25 mg Oral BID Corky Crafts, MD   25 mg at 02/23/18 2142  . morphine 2 MG/ML injection 2 mg  2 mg Intravenous Q2H PRN Corky Crafts, MD      . multivitamin with minerals tablet 1 tablet  1 tablet Oral Daily Corky Crafts, MD   1 tablet at 02/23/18 [phone]  . mupirocin ointment (BACTROBAN) 2 % 1 application  1 application Nasal BID Corky Crafts, MD   1 application at 02/23/18 2144  . nitroGLYCERIN (NITROSTAT) SL tablet 0.4 mg  0.4 mg Sublingual Q5 min PRN Corky Crafts, MD   0.4 mg at 02/20/18 1909  . ondansetron (ZOFRAN) injection 4 mg  4 mg Intravenous Q6H PRN Corky Crafts, MD      . rosuvastatin (CRESTOR) tablet 20 mg  20 mg Oral q1800 Nahser, Deloris Ping, MD      . sodium chloride flush (NS) 0.9 % injection 3 mL  3 mL Intravenous Q12H Corky Crafts, MD   3 mL at 02/23/18 1741  . sodium chloride flush (NS) 0.9 % injection 3 mL  3 mL Intravenous PRN Corky Crafts, MD      . thiamine (VITAMIN B-1) tablet 100 mg  100 mg Oral Daily Corky Crafts, MD   100 mg at 02/23/18 8119   Or  . thiamine (B-1) injection 100 mg  100 mg Intravenous Daily Corky Crafts, MD        Medications Prior to Admission  Medication Sig Dispense Refill Last Dose  . B Complex-C (B-COMPLEX WITH VITAMIN C) tablet Take 1 tablet by mouth every 3 (three) days.   8/1 or 8/2  . PRESCRIPTION MEDICATION Take 1 tablet by mouth every 3 (three) days.  Amlong MT (metoprolol succinate 47.5 mg & metoprolol tartrate 50 mg &  amlodipine 5 mg) - from [place]   02/18/2018  . PRESCRIPTION MEDICATION Take 1 tablet by mouth every other day. Diapride (metformin er 500 & glimepiride 1mg ) - from [place]   02/19/2018  . PRESCRIPTION MEDICATION Take 0.3 mg by mouth every other day. Volix (Voglibose) 0.3 mg  - from [place]   02/19/2018 at am  . PRESCRIPTION MEDICATION Take 0.25 mg by mouth daily as needed (sleep). Petril-MD (clonazepam 0.25 mg)  - from [place]   02/20/2018 at 200  . PRESCRIPTION MEDICATION Take 20 mg by mouth every 3 (three) days. Aztor 20 mg (atorvastatin) - from [place]   02/18/2018  . PRESCRIPTION MEDICATION Place 1 drop into the left eye 2 (two) times daily. Locula (sulphacetamide sodium 20%) - from [place]   3 weeks ago  . PRESCRIPTION MEDICATION Take 40 mg by mouth every 3 (three) days. Pantosec (pantoprazole 40 mg) - from [place]   02/19/2018 at Unknown time  . PRESCRIPTION MEDICATION Take 75 mg by mouth every 3 (three) days. Plagril Gold (clopidogrel 75 mg) - from [place]   02/19/2018 at am  . PRESCRIPTION MEDICATION Take 2.6 mg by mouth every 3 (three) days. Nitrocontine (glyceryl trinitrate 2.6 mg) - from [place]   few days ago  . PRESCRIPTION MEDICATION Take 100 mg by mouth daily as needed (erectile dysfunction). Vigore 100 (sildenafil citrate 100 mg) - from [place]   week ago  . PRESCRIPTION MEDICATION Take 1 tablet by mouth daily as needed (pain). Disprin (acetylsalicylic acid effervescent) - from [place]   02/20/2018 at 500    Family History  Problem Relation Age of Onset  . Hypertension Father      Review of Systems:   Pertinent items are noted in HPI.     Cardiac Review of Systems: Y or  [    ]= no  Chest Pain [ y   ]  Resting SOB [ n  ] Exertional SOB  Cove.Etienne  ]  Orthopnea [ n ]   Pedal Edema [ n  ]    Palpitations [ n ] Syncope  [ n ]   Presyncope [n   ]  General Review of Systems: [Y] = yes [  ]=no Constitional: recent weight change [  ]; anorexia [  ];  fatigue [  ]; nausea [  ]; night sweats [  ]; fever [  ]; or chills [  ]                                                               Dental: Last Dentist visit:   Eye : blurred vision [  ]; diplopia [   ]; vision changes [  ];  Amaurosis fugax[  ]; Resp: cough [  ];  wheezing[  ];  hemoptysis[  ]; shortness of breath[  ]; paroxysmal nocturnal dyspnea[  ]; dyspnea on exertion[  ]; or orthopnea[  ];  GI:  gallstones[  ], vomiting[  ];  dysphagia[  ]; melena[  ];  hematochezia [  ]; heartburn[  ];   Hx of  Colonoscopy[  ]; GU: kidney stones [  ]; hematuria[  ];   dysuria [  ];  nocturia[  ];  history of     obstruction [  ]; urinary frequency [  ]  Skin: rash, swelling[  ];, hair loss[  ];  peripheral edema[  ];  or itching[  ]; Musculosketetal: myalgias[  ];  joint swelling[  ];  joint erythema[  ];  joint pain[  ];  back pain[  ];  Heme/Lymph: bruising[  ];  bleeding[  ];  anemia[  ];  Neuro: TIA[  ];  headaches[  ];  stroke[ y ];  vertigo[  ];  seizures[  ];   paresthesias[  ];  difficulty walking[  ];  Psych:depression[  ]; anxiety[  ];  Endocrine: diabetes[ y ];  thyroid dysfunction[  ];               Physical Exam: BP 132/89   Pulse 83   Temp 98.2 F (36.8 C) (Oral)   Resp (!) 22   Ht 5\' 10"  (1.778 m)   Wt 179 lb 10.8 oz (81.5 kg)   SpO2 96%   BMI 25.78 kg/m    General appearance: alert, cooperative, appears older than stated age and no distress Head: Normocephalic, without obvious abnormality, atraumatic Neck: no adenopathy, no carotid bruit, no JVD, supple, symmetrical, trachea midline and thyroid not enlarged, symmetric, no tenderness/mass/nodules Lymph nodes: Cervical, supraclavicular, and axillary nodes normal. Resp: clear to auscultation bilaterally Back: symmetric, no curvature. ROM normal. No CVA tenderness. Cardio: regular rate and rhythm, S1, S2 normal, no murmur, click, rub or gallop GI: soft, non-tender; bowel sounds normal; no masses,  no  organomegaly Extremities: extremities normal, atraumatic, no cyanosis or edema, Homans sign is negative, no sign of DVT and Patient has palpable DP and PT pulses bilaterally appears to have adequate vein for bypass in both lower extremities Neurologic: Grossly normal  Diagnostic Studies & Laboratory data:     Recent Radiology Findings:  Dg Chest 2 View  Result Date: 02/20/2018 CLINICAL DATA:  Chest pain for 2 hours, pain down RIGHT arm. EXAM: CHEST - 2 VIEW COMPARISON:  None. FINDINGS: The heart size and mediastinal contours are within normal limits. Both lungs are clear. The visualized skeletal structures are unremarkable. IMPRESSION: No active cardiopulmonary disease. Electronically Signed   By: Bary Richard M.D.   On: 02/20/2018 07:26    I have independently reviewed the above radiologic studies and discussed with the patient    Recent Lab Findings: Lab Results  Component Value Date   WBC 7.3 02/24/2018   HGB 12.7 (L) 02/24/2018   HCT 39.6 02/24/2018   PLT 188 02/24/2018   GLUCOSE 142 (H) 02/23/2018   CHOL 201 (H) 02/20/2018   TRIG 135 02/20/2018   HDL 56 02/20/2018   LDLCALC 118 (H) 02/20/2018   ALT 17 02/20/2018   AST 22 02/20/2018   NA 140 02/23/2018   K 3.6 02/23/2018   CL 101 02/23/2018   CREATININE 1.19 02/23/2018   BUN 24 (H) 02/23/2018   CO2 27 02/23/2018   TSH 0.964 02/20/2018   HGBA1C 6.0 (H) 02/20/2018   Cath: Diagnostic  Dominance: Left  Left Main  Ost LM lesion 80% stenosed  Ost LM lesion is 80% stenosed.  Left Anterior Descending  Prox LAD to Mid LAD lesion 80% stenosed  Prox LAD to Mid LAD lesion is 80% stenosed.  Mid LAD lesion 75% stenosed  Mid LAD lesion is 75% stenosed.  First Diagonal Branch  Ost 1st Diag lesion 80% stenosed  Ost 1st Diag lesion is 80% stenosed.  Ramus Intermedius  Ramus lesion 80% stenosed  Ramus lesion is 80% stenosed.  Left Circumflex  Left Posterior  Descending Artery  LPDA lesion 70% stenosed  LPDA lesion is 70%  stenosed.  First Left Posterolateral Branch  1st LPL lesion 75% stenosed  1st LPL lesion is 75% stenosed.  Right Coronary Artery  Mid RCA lesion 100% stenosed  Mid RCA lesion is 100% stenosed.   Left Ventricle The left ventricular size is normal. The left ventricular systolic function is normal. LV end diastolic pressure is normal. The left ventricular ejection fraction is 55-65% by visual estimate. No regional wall motion abnormalities.  Aortic Valve There is no aortic valve stenosis.    Most Recent Value  AO Systolic Pressure 117 mmHg  AO Diastolic Pressure 62 mmHg  AO Mean 84 mmHg  LV Systolic Pressure 115 mmHg  LV Diastolic Pressure 11 mmHg  LV EDP 14 mmHg  AOp Systolic Pressure 114 mmHg  AOp Diastolic Pressure 60 mmHg  AOp Mean Pressure 83 mmHg  LVp Systolic Pressure 111 mmHg  LVp Diastolic Pressure 10 mmHg  LVp EDP Pressure 15 mmHg    I have independently reviewed the above  cath films and reviewed the findings with the  patient .    Patient:    Taos, Tapp MR #:       [id number] Study Date: 02/21/2018 Gender:     M Age:        38 Height:     177.8 cm Weight:     79.4 kg BSA:        1.99 m^2 Pt. Status: Room:       3E26C   ATTENDING    Gilda Crease  ADMITTING    Inez Catalina  PERFORMING   [facility], Inpatient  SONOGRAPHER  Sinda Du, RDCS  ORDERING     Ernest Mallick  REFERRING    Bazemore, Taylor  cc:  ------------------------------------------------------------------- LV EF: 40% -   45%  ------------------------------------------------------------------- Indications:      Chest pain 786.51.  ------------------------------------------------------------------- History:   PMH:   Atrial fibrillation.  Coronary artery disease. Risk factors:  Hypertension. Diabetes mellitus. Dyslipidemia.  ------------------------------------------------------------------- Study Conclusions  - Left ventricle: Due to varibility in heart rate EF  difficult to   esitmated. The cavity size was normal. Systolic function was   mildly to moderately reduced. The estimated ejection fraction was   in the range of 40% to 45%. Wall motion was normal; there were no   regional wall motion abnormalities. - Aortic root: The aortic root was mildly dilated. - Right atrium: The atrium was mildly dilated.  ------------------------------------------------------------------- Study data:   Study status:  Routine.  Procedure:  The patient reported no pain pre or post test. Transthoracic echocardiography. Image quality was adequate.  Study completion:  There were no complications.          Transthoracic echocardiography.  M-mode, complete 2D, spectral Doppler, and color Doppler.  Birthdate: Patient birthdate: [birth year].  Age:  Patient is 72 yr old.  Sex: Gender: male.    BMI: 25.1 kg/m^2.  Blood pressure:     159/99 Patient status:  Inpatient.  Study date:  Study date: 02/21/2018. Study time: 01:30 PM.  Location:  Bedside.  -------------------------------------------------------------------  ------------------------------------------------------------------- Left ventricle:  Due to varibility in heart rate EF difficult to esitmated. The cavity size was normal. Systolic function was mildly to moderately reduced. The estimated ejection fraction was in the range of 40% to 45%. Wall motion was normal; there were no regional wall motion abnormalities.  ------------------------------------------------------------------- Aortic valve:   The valve appears to be grossly normal.  Normal thickness leaflets. Mobility was not restricted.  Doppler: Transvalvular velocity was within the normal range. There was no stenosis. There was no regurgitation.  ------------------------------------------------------------------- Aorta:  Aortic root: The aortic root was mildly dilated.  ------------------------------------------------------------------- Mitral  valve:   Structurally normal valve.   Mobility was not restricted.  Doppler:  Transvalvular velocity was within the normal range. There was no evidence for stenosis. There was no regurgitation.  ------------------------------------------------------------------- Left atrium:  The atrium was normal in size.  ------------------------------------------------------------------- Right ventricle:  The cavity size was normal. Wall thickness was normal. Systolic function was normal.  ------------------------------------------------------------------- Pulmonic valve:   Not well visualized.  Doppler:  Transvalvular velocity was within the normal range. There was no evidence for stenosis.  ------------------------------------------------------------------- Tricuspid valve:   Structurally normal valve.    Doppler: Transvalvular velocity was within the normal range. There was mild regurgitation.  ------------------------------------------------------------------- Pulmonary artery:   The main pulmonary artery was normal-sized. Systolic pressure was within the normal range.  ------------------------------------------------------------------- Right atrium:  The atrium was mildly dilated.  ------------------------------------------------------------------- Pericardium:  There was no pericardial effusion.  ------------------------------------------------------------------- Systemic veins: Inferior vena cava: The vessel was normal in size.  ------------------------------------------------------------------- Measurements   Left ventricle                         Value        Reference  LV ID, ED, PLAX chordal                45    mm     43 - 52  LV ID, ES, PLAX chordal                28    mm     23 - 38  LV fx shortening, PLAX chordal         38    %      >=29  LV PW thickness, ED                    13    mm     ----------  IVS/LV PW ratio, ED                    0.77         <=1.3   Stroke volume, 2D                      42    ml     ----------  Stroke volume/bsa, 2D                  21    ml/m^2 ----------    Ventricular septum                     Value        Reference  IVS thickness, ED                      10    mm     ----------    LVOT                                   Value        Reference  LVOT ID, S  19    mm     ----------  LVOT area                              2.84  cm^2   ----------  LVOT peak velocity, S                  87.2  cm/s   ----------  LVOT mean velocity, S                  52.7  cm/s   ----------  LVOT VTI, S                            14.7  cm     ----------  LVOT peak gradient, S                  3     mm Hg  ----------    Aorta                                  Value        Reference  Aortic root ID, ED                     32    mm     ----------  Ascending aorta ID, A-P, S             41    mm     ----------    Left atrium                            Value        Reference  LA ID, A-P, ES                         33    mm     ----------  LA ID/bsa, A-P                         1.66  cm/m^2 <=2.2  LA volume, S                           43.2  ml     ----------  LA volume/bsa, S                       21.7  ml/m^2 ----------  LA volume, ES, 1-p A4C                 40.9  ml     ----------  LA volume/bsa, ES, 1-p A4C             20.6  ml/m^2 ----------  LA volume, ES, 1-p A2C                 43.5  ml     ----------  LA volume/bsa, ES, 1-p A2C             21.9  ml/m^2 ----------    Pulmonary arteries                     Value  Reference  PA pressure, S, DP                     19    mm Hg  <=30    Tricuspid valve                        Value        Reference  Tricuspid regurg peak velocity         203   cm/s   ----------  Tricuspid peak RV-RA gradient          16    mm Hg  ----------    Right atrium                           Value        Reference  RA ID, S-I, ES, A4C            (H)     56.1  mm     34 - 49   RA area, ES, A4C               (H)     23.1  cm^2   8.3 - 19.5  RA volume, ES, A/L                     77.3  ml     ----------  RA volume/bsa, ES, A/L                 38.9  ml/m^2 ----------    Systemic veins                         Value        Reference  Estimated CVP                          3     mm Hg  ----------    Right ventricle                        Value        Reference  TAPSE                                  15.5  mm     ----------  RV pressure, S, DP                     19    mm Hg  <=30  RV s&', lateral, S                      10.3  cm/s   ----------  Legend: (L)  and  (H)  mark values outside specified reference range.  ------------------------------------------------------------------- Prepared and Electronically Authenticated by  Georga Hacking, MD [facility] 2019-08-04T15:02:58       Lab Results  Component Value Date   TROPONINI 4.46 (HH) 02/20/2018    Assessment / Plan:   Coronary artery disease with left main obstruction, poor quality distal vessels, but distal LAD distal circumflex and PDA arising from the circumflex are probably bypassable  presented with non-STEMI myocardial infarction troponins up to 4.46 History of hypertension Hyperlipidemia History of alcohol abuse per son, patient notes that he walks  every morning and has 1 drink every evening.  After extensive discussion through the interpreter with the patient including the current anatomic situation with his coronary artery disease including left main obstruction I reviewed with him various treatment options including cardiac coronary artery bypass surgery.  After complete discussion about the risks and options of surgery and our recommendation both from cardiology and from cardiac surgery would be to proceed with coronary artery bypass grafting the patient is agreeable.  The risks of death infection stroke myocardial infarction bleeding blood transfusion were all discussed.  Patient and his son's  questions have been answered.  We will plan for surgery August 10 Friday 7:30 AM.     I  spent 60 minutes counseling the patient face to face.   Delight Ovens MD      [address] [place] [place] [address] Office [phone]   Beeper [phone]  02/24/2018 9:52 AM

## 2018-02-24 NOTE — Care Management Note (Signed)
Case Management Note Donn PieriniKristi Nevin Kozuch RN,BSN [facility] 1[facility]22 Case Manager  [phone]  Patient Details  Name: Shane Yates MRN: [id number] Date of Birth: 06/15/1946  Subjective/Objective:   Pt admitted with ACS, 3VD, plan for CBAG on 02/26/18                 Action/Plan: PTA pt lived at home, speaks Hindi, son acts as Equities traderinterpreter. CM to follow post op for transition of care needs.   Expected Discharge Date:                  Expected Discharge Plan:     In-House Referral:     Discharge planning Services  CM Consult  Post Acute Care Choice:    Choice offered to:     DME Arranged:    DME Agency:     HH Arranged:    HH Agency:     Status of Service:  In process, will continue to follow  If discussed at Long Length of Stay Meetings, dates discussed:    Discharge Disposition:   Additional Comments:  Darrold SpanWebster, Renel Ende Hall, RN 02/24/2018, 12:41 PM

## 2018-02-24 NOTE — Progress Notes (Signed)
ANTICOAGULATION CONSULT NOTE - Follow Up Consult  Pharmacy Consult for Heparin Indication: NSTEMI  No Known Allergies  Patient Measurements: Height: 5\' 10"  (177.8 cm) Weight: 179 lb 10.8 oz (81.5 kg) IBW/kg (Calculated) : 73 Heparin Dosing Weight: 81.5 kg  Vital Signs: Temp: 98.3 F (36.8 C) (08/07 1100) Temp Source: Oral (08/07 1100) BP: 137/73 (08/07 1200) Pulse Rate: 69 (08/07 1200)  Labs: Recent Labs    02/22/18 0548 02/22/18 1441 02/23/18 0620 02/24/18 0219 02/24/18 1225  HGB 14.3  --  13.8 12.7*  --   HCT 44.0  --  43.4 39.6  --   PLT 204  --  202 188  --   HEPARINUNFRC 0.27*  --  0.55 0.19* 0.39  CREATININE  --  1.14 1.19  --   --     Estimated Creatinine Clearance: 58.8 mL/min (by C-G formula based on SCr of 1.19 mg/dL).   Medications:  Scheduled:  . amLODipine  5 mg Oral Daily  . aspirin  81 mg Oral Daily  . folic acid  1 mg Oral Daily  . insulin aspart  0-5 Units Subcutaneous QHS  . insulin aspart  0-9 Units Subcutaneous TID WC  . metoprolol tartrate  25 mg Oral BID  . multivitamin with minerals  1 tablet Oral Daily  . mupirocin ointment  1 application Nasal BID  . rosuvastatin  20 mg Oral q1800  . sodium chloride flush  3 mL Intravenous Q12H  . thiamine  100 mg Oral Daily   Or  . thiamine  100 mg Intravenous Daily   Infusions:  . sodium chloride    . heparin 1,200 Units/hr (02/24/18 1200)    Assessment: 72 yo M s/p cath resumed on heparin for multivessel CAD. CABG scheduled for 02/27/18.   Heparin level therapeutic at 0.39, drawn at 1230 - will continue same dose of 1200 units . Hg down to 12.7, plt wnl. No bleed or issues with infusion per discussion with RN.  Goal of Therapy:  Heparin level 0.3-0.7 units/ml Monitor platelets by anticoagulation protocol: Yes   Plan:  Continue heparin 1200 units/hr 8h heparin level Monitor daily heparin level and CBC, s/sx bleeding  Shelby Dubinraig Leopoldo Mazzie, PharmD Candidate 02/24/2018 1:12 PM

## 2018-02-25 ENCOUNTER — Other Ambulatory Visit (HOSPITAL_COMMUNITY): Payer: Self-pay | Admitting: Respiratory Therapy

## 2018-02-25 ENCOUNTER — Inpatient Hospital Stay (HOSPITAL_COMMUNITY): Payer: BLUE CROSS/BLUE SHIELD

## 2018-02-25 LAB — PULMONARY FUNCTION TEST
FEF 25-75 Pre: 1.34 L/sec
FEF2575-%Pred-Pre: 56 %
FEV1-%Pred-Pre: 56 %
FEV1-Pre: 1.8 L
FEV1FVC-%Pred-Pre: 106 %
FEV6-%Pred-Pre: 56 %
FEV6-Pre: 2.32 L
FEV6FVC-%Pred-Pre: 106 %
FVC-%Pred-Pre: 53 %
FVC-Pre: 2.32 L
Pre FEV1/FVC ratio: 78 %
Pre FEV6/FVC Ratio: 100 %

## 2018-02-25 LAB — CBC
HCT: 38.9 % — ABNORMAL LOW (ref 39.0–52.0)
Hemoglobin: 12.5 g/dL — ABNORMAL LOW (ref 13.0–17.0)
MCH: 30.4 pg (ref 26.0–34.0)
MCHC: 32.1 g/dL (ref 30.0–36.0)
MCV: 94.6 fL (ref 78.0–100.0)
PLATELETS: 191 10*3/uL (ref 150–400)
RBC: 4.11 MIL/uL — AB (ref 4.22–5.81)
RDW: 12.6 % (ref 11.5–15.5)
WBC: 6.7 10*3/uL (ref 4.0–10.5)

## 2018-02-25 LAB — COMPREHENSIVE METABOLIC PANEL
ALT: 24 U/L (ref 0–44)
AST: 32 U/L (ref 15–41)
Albumin: 3.5 g/dL (ref 3.5–5.0)
Alkaline Phosphatase: 65 U/L (ref 38–126)
Anion gap: 11 (ref 5–15)
BUN: 13 mg/dL (ref 8–23)
CO2: 27 mmol/L (ref 22–32)
Calcium: 9.1 mg/dL (ref 8.9–10.3)
Chloride: 103 mmol/L (ref 98–111)
Creatinine, Ser: 0.99 mg/dL (ref 0.61–1.24)
GFR calc Af Amer: >60 mL/min (ref >60)
GFR calc non Af Amer: >60 mL/min (ref >60)
Glucose, Bld: 115 mg/dL — ABNORMAL HIGH (ref 70–99)
Potassium: 3.6 mmol/L (ref 3.5–5.1)
Sodium: 141 mmol/L (ref 135–145)
Total Bilirubin: 1.4 mg/dL — ABNORMAL HIGH (ref 0.3–1.2)
Total Protein: 6.1 g/dL — ABNORMAL LOW (ref 6.5–8.1)

## 2018-02-25 LAB — GLUCOSE, CAPILLARY
GLUCOSE-CAPILLARY: 124 mg/dL — AB (ref 70–99)
GLUCOSE-CAPILLARY: 110 mg/dL — AB (ref 70–99)
Glucose-Capillary: 181 mg/dL — ABNORMAL HIGH (ref 70–99)
Glucose-Capillary: 141 mg/dL — ABNORMAL HIGH (ref 70–99)

## 2018-02-25 LAB — PROTIME-INR
INR: 1.13
Prothrombin Time: 14.4 seconds (ref 11.4–15.2)

## 2018-02-25 LAB — TYPE AND SCREEN
ABO/RH(D): B POS
Antibody Screen: NEGATIVE

## 2018-02-25 LAB — ABO/RH: ABO/RH(D): B POS

## 2018-02-25 LAB — HEPARIN LEVEL (UNFRACTIONATED): HEPARIN UNFRACTIONATED: 0.52 [IU]/mL (ref 0.30–0.70)

## 2018-02-25 LAB — PLATELET INHIBITION P2Y12: Platelet Function  P2Y12: 262 [PRU] (ref 194–418)

## 2018-02-25 MED ORDER — TRANEXAMIC ACID 1000 MG/10ML IV SOLN
1.5000 mg/kg/h | INTRAVENOUS | Status: AC
  Administered 2018-02-26: 1.5 mg/kg/h via INTRAVENOUS
  Filled 2018-02-25: qty 25

## 2018-02-25 MED ORDER — METOPROLOL TARTRATE 12.5 MG HALF TABLET
12.5000 mg | ORAL_TABLET | Freq: Once | ORAL | Status: AC
  Administered 2018-02-26: 12.5 mg via ORAL
  Filled 2018-02-25: qty 1

## 2018-02-25 MED ORDER — VANCOMYCIN HCL 10 G IV SOLR
1250.0000 mg | INTRAVENOUS | Status: AC
  Administered 2018-02-26: 1250 mg via INTRAVENOUS
  Filled 2018-02-25: qty 1250

## 2018-02-25 MED ORDER — DEXMEDETOMIDINE HCL IN NACL 400 MCG/100ML IV SOLN
0.1000 ug/kg/h | INTRAVENOUS | Status: AC
  Administered 2018-02-26: .7 ug/kg/h via INTRAVENOUS
  Filled 2018-02-25: qty 100

## 2018-02-25 MED ORDER — MILRINONE LACTATE IN DEXTROSE 20-5 MG/100ML-% IV SOLN
0.1250 ug/kg/min | INTRAVENOUS | Status: DC
  Filled 2018-02-25: qty 100

## 2018-02-25 MED ORDER — CHLORHEXIDINE GLUCONATE 0.12 % MT SOLN
15.0000 mL | Freq: Once | OROMUCOSAL | Status: AC
  Administered 2018-02-26: 15 mL via OROMUCOSAL
  Filled 2018-02-25: qty 15

## 2018-02-25 MED ORDER — CHLORHEXIDINE GLUCONATE CLOTH 2 % EX PADS
6.0000 | MEDICATED_PAD | Freq: Once | CUTANEOUS | Status: AC
  Administered 2018-02-25: 6 via TOPICAL

## 2018-02-25 MED ORDER — PLASMA-LYTE 148 IV SOLN
INTRAVENOUS | Status: AC
  Administered 2018-02-26: 500 mL
  Filled 2018-02-25: qty 2.5

## 2018-02-25 MED ORDER — SODIUM CHLORIDE 0.9 % IV SOLN
INTRAVENOUS | Status: DC
  Filled 2018-02-25: qty 30

## 2018-02-25 MED ORDER — SODIUM CHLORIDE 0.9 % IV SOLN
1.5000 g | INTRAVENOUS | Status: AC
  Administered 2018-02-26: 1.5 g via INTRAVENOUS
  Filled 2018-02-25: qty 1.5

## 2018-02-25 MED ORDER — PHENYLEPHRINE HCL 10 MG/ML IJ SOLN
30.0000 ug/min | INTRAMUSCULAR | Status: DC
  Filled 2018-02-25: qty 2

## 2018-02-25 MED ORDER — TEMAZEPAM 15 MG PO CAPS: 15.0000 mg | ORAL_CAPSULE | Freq: Once | ORAL | Status: DC | PRN

## 2018-02-25 MED ORDER — POTASSIUM CHLORIDE 2 MEQ/ML IV SOLN
80.0000 meq | INTRAVENOUS | Status: DC
  Filled 2018-02-25: qty 40

## 2018-02-25 MED ORDER — CEFUROXIME SODIUM 750 MG IJ SOLR
750.0000 mg | INTRAMUSCULAR | Status: DC
  Filled 2018-02-25: qty 750

## 2018-02-25 MED ORDER — MAGNESIUM SULFATE 50 % IJ SOLN
40.0000 meq | INTRAMUSCULAR | Status: DC
  Filled 2018-02-25: qty 9.85

## 2018-02-25 MED ORDER — DEXTROSE 5 % IV SOLN
0.0000 ug/min | INTRAVENOUS | Status: DC
  Filled 2018-02-25: qty 4

## 2018-02-25 MED ORDER — NITROGLYCERIN IN D5W 200-5 MCG/ML-% IV SOLN
2.0000 ug/min | INTRAVENOUS | Status: AC
  Administered 2018-02-26: 20 ug/min via INTRAVENOUS
  Filled 2018-02-25: qty 250

## 2018-02-25 MED ORDER — TRANEXAMIC ACID (OHS) BOLUS VIA INFUSION
15.0000 mg/kg | INTRAVENOUS | Status: AC
  Administered 2018-02-26: 1222.5 mg via INTRAVENOUS
  Filled 2018-02-25: qty 1223

## 2018-02-25 MED ORDER — DOPAMINE-DEXTROSE 3.2-5 MG/ML-% IV SOLN
0.0000 ug/kg/min | INTRAVENOUS | Status: DC
  Filled 2018-02-25: qty 250

## 2018-02-25 MED ORDER — CHLORHEXIDINE GLUCONATE CLOTH 2 % EX PADS
6.0000 | MEDICATED_PAD | Freq: Once | CUTANEOUS | Status: AC
  Administered 2018-02-26: 6 via TOPICAL

## 2018-02-25 MED ORDER — TRANEXAMIC ACID (OHS) PUMP PRIME SOLUTION
2.0000 mg/kg | INTRAVENOUS | Status: DC
  Filled 2018-02-25: qty 1.63

## 2018-02-25 MED ORDER — SODIUM CHLORIDE 0.9 % IV SOLN
INTRAVENOUS | Status: AC
Start: 2018-02-26 — End: 2018-02-26
  Administered 2018-02-26: 1 [IU]/h via INTRAVENOUS
  Filled 2018-02-25: qty 1

## 2018-02-25 NOTE — Progress Notes (Signed)
Patient ID: Shane Yates, male   DOB: 08-28-1945, 72 y.o.   MRN: [id number] TCTS DAILY ICU PROGRESS NOTE                   [address]            [name] [address]          [phone]   2 Days Post-Op Procedure(s) (LRB): LEFT HEART CATH AND CORONARY ANGIOGRAPHY (N/A)  Total Length of Stay:  LOS: 5 days   Subjective: No chest pain, some neck shoulder discomfort but says is not like pain on admission   Objective: Vital signs in last 24 hours: Temp:  [97.7 F (36.5 C)-98.4 F (36.9 C)] 98.1 F (36.7 C) (08/08 0350) Pulse Rate:  [59-83] 62 (08/08 0500) Cardiac Rhythm: Normal sinus rhythm (08/08 0350) Resp:  [9-32] 21 (08/08 0500) BP: (104-152)/(53-98) 119/72 (08/08 0500) SpO2:  [95 %-99 %] 96 % (08/08 0300)  Filed Weights   02/22/18 0348 02/23/18 0416 02/23/18 1434  Weight: 80.5 kg 80.5 kg 81.5 kg    Weight change:    Hemodynamic parameters for last 24 hours:    Intake/Output from previous day: 08/07 0701 - 08/08 0700 In: 1225.5 [P.O.:960; I.V.:265.5] Out: 300 [Urine:300]  Intake/Output this shift: No intake/output data recorded.  Current Meds: Scheduled Meds: . amLODipine  5 mg Oral Daily  . aspirin  81 mg Oral Daily  . folic acid  1 mg Oral Daily  . [START ON 02/26/2018] heparin-papaverine-plasmalyte irrigation   Irrigation To OR  . insulin aspart  0-5 Units Subcutaneous QHS  . insulin aspart  0-9 Units Subcutaneous TID WC  . [START ON 02/26/2018] magnesium sulfate  40 mEq Other To OR  . metoprolol tartrate  25 mg Oral BID  . multivitamin with minerals  1 tablet Oral Daily  . mupirocin ointment  1 application Nasal BID  . [START ON 02/26/2018] potassium chloride  80 mEq Other To OR  . rosuvastatin  20 mg Oral q1800  . sodium chloride flush  3 mL Intravenous Q12H  . thiamine  100 mg Oral Daily   Or  . thiamine  100 mg Intravenous Daily  . [START ON 02/26/2018] tranexamic acid  15 mg/kg Intravenous To OR  . [START ON 02/26/2018] tranexamic acid  2  mg/kg Intracatheter To OR   Continuous Infusions: . sodium chloride    . [START ON 02/26/2018] cefUROXime (ZINACEF)  IV    . [START ON 02/26/2018] cefUROXime (ZINACEF)  IV    . [START ON 02/26/2018] dexmedetomidine    . [START ON 02/26/2018] DOPamine    . [START ON 02/26/2018] epinephrine    . [START ON 02/26/2018] heparin 30,000 units/NS 1000 mL solution for CELLSAVER    . heparin 1,200 Units/hr (02/25/18 0300)  . [START ON 02/26/2018] insulin (NOVOLIN-R) infusion    . milrinone    . [START ON 02/26/2018] nitroGLYCERIN    . [START ON 02/26/2018] phenylephrine 20mg /264mL NS (0.08mg /ml) infusion    . [START ON 02/26/2018] tranexamic acid (CYKLOKAPRON) infusion ([facility])    . [START ON 02/26/2018] vancomycin     PRN Meds:.sodium chloride, acetaminophen, gi cocktail, morphine injection, nitroGLYCERIN, ondansetron (ZOFRAN) IV, sodium chloride flush  General appearance: alert, cooperative, appears older than stated age and no distress Neurologic: intact Heart: regular rate and rhythm, S1, S2 normal, no murmur, click, rub or gallop Lungs: clear to auscultation bilaterally Abdomen: soft, non-tender; bowel sounds normal; no masses,  no organomegaly Extremities: extremities normal,  atraumatic, no cyanosis or edema  Lab Results: CBC: Recent Labs    02/24/18 0219 02/25/18 0351  WBC 7.3 6.7  HGB 12.7* 12.5*  HCT 39.6 38.9*  PLT 188 191   BMET:  Recent Labs    02/23/18 0620 02/25/18 0351  NA 140 141  K 3.6 3.6  CL 101 103  CO2 27 27  GLUCOSE 142* 115*  BUN 24* 13  CREATININE 1.19 0.99  CALCIUM 9.2 9.1    CMET: Lab Results  Component Value Date   WBC 6.7 02/25/2018   HGB 12.5 (L) 02/25/2018   HCT 38.9 (L) 02/25/2018   PLT 191 02/25/2018   GLUCOSE 115 (H) 02/25/2018   CHOL 201 (H) 02/20/2018   TRIG 135 02/20/2018   HDL 56 02/20/2018   LDLCALC 118 (H) 02/20/2018   ALT 24 02/25/2018   AST 32 02/25/2018   NA 141 02/25/2018   K 3.6 02/25/2018   CL 103 02/25/2018   CREATININE 0.99 02/25/2018     BUN 13 02/25/2018   CO2 27 02/25/2018   TSH 0.964 02/20/2018   INR 1.13 02/25/2018   HGBA1C 6.0 (H) 02/20/2018      PT/INR:  Recent Labs    02/25/18 0351  LABPROT 14.4  INR 1.13   Radiology: No results found.   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 The risks of the procedure including death, infection, stroke, myocardial infarction, bleeding, blood transfusion have all been discussed specifically.  I have quoted Shane Yates a 2 % of perioperative mortality and a complication rate as high as 40 %. The patient's questions have been answered.Shane Yates is willing  to proceed with the planned procedure.  Delight Ovensdward B Madison Direnzo 02/25/2018 8:07 AM

## 2018-02-25 NOTE — Progress Notes (Signed)
CARDIAC REHAB PHASE I   Pre-op education completed with pts family. Cardiac surgery booklet, [facility] care guide and in-the-tube sheet given. Family states understanding of need for 24/7 care after discharge. Pt demonstrated 1875 on IS. Educated on importance of IS use and walking after surgery. Will continue to follow.  [phone] Reynold Boweneresa  Promiss Labarbera, RN BSN 02/25/2018 2:46 PM

## 2018-02-25 NOTE — Progress Notes (Signed)
ANTICOAGULATION CONSULT NOTE - Follow Up Consult  Pharmacy Consult for Heparin Indication: NSTEMI  No Known Allergies  Patient Measurements: Height: 5\' 10"  (177.8 cm) Weight: 179 lb 10.8 oz (81.5 kg) IBW/kg (Calculated) : 73 Heparin Dosing Weight: 81.5 kg  Vital Signs: Temp: 98.1 F (36.7 C) (08/08 0350) Temp Source: Oral (08/08 0350) BP: 119/72 (08/08 0500) Pulse Rate: 62 (08/08 0500)  Labs: Recent Labs    02/22/18 1441  02/23/18 0620 02/24/18 0219 02/24/18 1225 02/24/18 2052 02/25/18 0351  HGB  --    < > 13.8 12.7*  --   --  12.5*  HCT  --   --  43.4 39.6  --   --  38.9*  PLT  --   --  202 188  --   --  191  LABPROT  --   --   --   --   --   --  14.4  INR  --   --   --   --   --   --  1.13  HEPARINUNFRC  --    < > 0.55 0.19* 0.39 0.52 0.52  CREATININE 1.14  --  1.19  --   --   --  0.99   < > = values in this interval not displayed.    Estimated Creatinine Clearance: 70.7 mL/min (by C-G formula based on SCr of 0.99 mg/dL).   Medications:  Scheduled:  . amLODipine  5 mg Oral Daily  . aspirin  81 mg Oral Daily  . folic acid  1 mg Oral Daily  . insulin aspart  0-5 Units Subcutaneous QHS  . insulin aspart  0-9 Units Subcutaneous TID WC  . metoprolol tartrate  25 mg Oral BID  . multivitamin with minerals  1 tablet Oral Daily  . mupirocin ointment  1 application Nasal BID  . rosuvastatin  20 mg Oral q1800  . sodium chloride flush  3 mL Intravenous Q12H  . thiamine  100 mg Oral Daily   Or  . thiamine  100 mg Intravenous Daily   Infusions:  . sodium chloride    . heparin 1,200 Units/hr (02/25/18 0300)    Assessment: 72 yo M s/p cath resumed on heparin for multivessel CAD. CABG scheduled for 02/27/18.  -Heparin level remains at goal, CBC stable  Goal of Therapy:  Heparin level 0.3-0.7 units/ml Monitor platelets by anticoagulation protocol: Yes   Plan:  -No heparin changes needed -Daily CBC and heparin level  Harland GermanAndrew Shirl Weir, PharmD Clinical  Pharmacist Please check Amion for pharmacy contact number

## 2018-02-25 NOTE — Plan of Care (Addendum)
Pt currently in chair. Family at bedside. Consents done with video interpreter. No complaints of pain. Vital signs stable throughout shift. NPO at midnight. Pt ambulates to and from bathroom. No new changes at this time. No new complaints at this time. Will continue to monitor.  Problem: Education: Goal: Knowledge of General Education information will improve Description Including pain rating scale, medication(s)/side effects and non-pharmacologic comfort measures Outcome: Progressing   Problem: Health Behavior/Discharge Planning: Goal: Ability to manage health-related needs will improve Outcome: Progressing   Problem: Clinical Measurements: Goal: Ability to maintain clinical measurements within normal limits will improve Outcome: Progressing Goal: Will remain free from infection Outcome: Progressing Goal: Diagnostic test results will improve Outcome: Progressing Goal: Respiratory complications will improve Outcome: Progressing Goal: Cardiovascular complication will be avoided Outcome: Progressing   Problem: Activity: Goal: Risk for activity intolerance will decrease Outcome: Progressing   Problem: Coping: Goal: Level of anxiety will decrease Outcome: Progressing   Problem: Elimination: Goal: Will not experience complications related to urinary retention Outcome: Progressing   Problem: Skin Integrity: Goal: Risk for impaired skin integrity will decrease Outcome: Progressing

## 2018-02-25 NOTE — Progress Notes (Signed)
Progress Note  Patient Name: Shane Yates Date of Encounter: 02/25/2018  Primary Cardiologist:  New to [facility]   Subjective   72 year old gentleman from [place].  He speaks primarily Hindi.  We have communicated to him via his son who acts as interpreter  Heart catheterization yesterday reveals three-vessel coronary artery disease.  TCTS  has been consulted for the possibility of coronary artery bypass grafting.  He is feeling well.  Is not having any angina.  He is scheduled for surgery tomorrow.   His P2 Y 12 levels are essentially normal.  Inpatient Medications    Scheduled Meds: . amLODipine  5 mg Oral Daily  . aspirin  81 mg Oral Daily  . folic acid  1 mg Oral Daily  . [START ON 02/26/2018] heparin-papaverine-plasmalyte irrigation   Irrigation To OR  . insulin aspart  0-5 Units Subcutaneous QHS  . insulin aspart  0-9 Units Subcutaneous TID WC  . [START ON 02/26/2018] magnesium sulfate  40 mEq Other To OR  . metoprolol tartrate  25 mg Oral BID  . multivitamin with minerals  1 tablet Oral Daily  . mupirocin ointment  1 application Nasal BID  . [START ON 02/26/2018] potassium chloride  80 mEq Other To OR  . rosuvastatin  20 mg Oral q1800  . sodium chloride flush  3 mL Intravenous Q12H  . thiamine  100 mg Oral Daily   Or  . thiamine  100 mg Intravenous Daily  . [START ON 02/26/2018] tranexamic acid  15 mg/kg Intravenous To OR  . [START ON 02/26/2018] tranexamic acid  2 mg/kg Intracatheter To OR   Continuous Infusions: . sodium chloride    . [START ON 02/26/2018] cefUROXime (ZINACEF)  IV    . [START ON 02/26/2018] cefUROXime (ZINACEF)  IV    . [START ON 02/26/2018] dexmedetomidine    . [START ON 02/26/2018] DOPamine    . [START ON 02/26/2018] epinephrine    . [START ON 02/26/2018] heparin 30,000 units/NS 1000 mL solution for CELLSAVER    . heparin 1,200 Units/hr (02/25/18 0800)  . [START ON 02/26/2018] insulin (NOVOLIN-R) infusion    . milrinone    . [START ON 02/26/2018] nitroGLYCERIN    .  [START ON 02/26/2018] phenylephrine 20mg /22mL NS (0.08mg /ml) infusion    . [START ON 02/26/2018] tranexamic acid (CYKLOKAPRON) infusion ([facility])    . [START ON 02/26/2018] vancomycin     PRN Meds: sodium chloride, acetaminophen, gi cocktail, morphine injection, nitroGLYCERIN, ondansetron (ZOFRAN) IV, sodium chloride flush   Vital Signs    Vitals:   02/25/18 0500 02/25/18 0700 02/25/18 0809 02/25/18 0811  BP: 119/72 124/77  123/78  Pulse: 62 63  69  Resp: (!) 21 19  17   Temp:   98.1 F (36.7 C)   TempSrc:   Oral   SpO2:  96%  97%  Weight:      Height:        Intake/Output Summary (Last 24 hours) at 02/25/2018 0838 Last data filed at 02/25/2018 0800 Gross per 24 hour  Intake 1006.48 ml  Output 300 ml  Net 706.48 ml   Filed Weights   02/22/18 0348 02/23/18 0416 02/23/18 1434  Weight: 80.5 kg 80.5 kg 81.5 kg    Telemetry    NSR  - Personally Reviewed  ECG     NSR  - Personally Reviewed  Physical Exam   Physical Exam: Blood pressure 123/78, pulse 69, temperature 98.1 F (36.7 C), temperature source Oral, resp. rate 17, height 5\' 10"  (  1.778 m), weight 81.5 kg, SpO2 97 %.  GEN:   Elderly gentleman, no acute distress HEENT: Normal NECK: No JVD; No carotid bruits LYMPHATICS: No lymphadenopathy CARDIAC: RRR  RESPIRATORY:  Clear to auscultation without rales, wheezing or rhonchi  ABDOMEN: Soft, non-tender, non-distended MUSCULOSKELETAL:  No edema; No deformity  SKIN: Warm and dry NEUROLOGIC:  Alert and oriented x 3   Labs    Chemistry Recent Labs  Lab 02/20/18 0628 02/22/18 1441 02/23/18 0620 02/25/18 0351  NA 138 139 140 141  K 3.6 3.5 3.6 3.6  CL 101 103 101 103  CO2 28 26 27 27   GLUCOSE 153* 195* 142* 115*  BUN 13 18 24* 13  CREATININE 0.83 1.14 1.19 0.99  CALCIUM 9.1 9.0 9.2 9.1  PROT 7.1  --   --  6.1*  ALBUMIN 4.2  --   --  3.5  AST 22  --   --  32  ALT 17  --   --  24  ALKPHOS 69  --   --  65  BILITOT 1.0  --   --  1.4*  GFRNONAA >60 >60 60* >60    GFRAA >60 >60 >60 >60  ANIONGAP 9 10 12 11      Hematology Recent Labs  Lab 02/23/18 0620 02/24/18 0219 02/25/18 0351  WBC 7.5 7.3 6.7  RBC 4.57 4.20* 4.11*  HGB 13.8 12.7* 12.5*  HCT 43.4 39.6 38.9*  MCV 95.0 94.3 94.6  MCH 30.2 30.2 30.4  MCHC 31.8 32.1 32.1  RDW 12.6 12.7 12.6  PLT 202 188 191    Cardiac Enzymes Recent Labs  Lab 02/20/18 0628 02/20/18 1046 02/20/18 1615 02/20/18 2230  TROPONINI <0.03 0.21* 1.94* 4.46*   No results for input(s): TROPIPOC in the last 168 hours.   BNPNo results for input(s): BNP, PROBNP in the last 168 hours.   DDimer No results for input(s): DDIMER in the last 168 hours.   Radiology    No results found.  Cardiac Studies     Patient Profile     72 y.o. male admitted with non-ST segment elevation myocardial infarction.  Assessment & Plan    1.  Coronary artery disease: The patient has been seen by Dr. Tyrone SageGerhardt.  He is scheduled for surgery tomorrow.  The patient and the patient's family did not have any questions.  They are looking forward to getting through with the surgery.  2.  Hyperlipidemia: Continue rosuvastatin 20 mg a day.  For questions or updates, please contact [facility] Please consult [email or web address] for contact info under Cardiology/STEMI.      Signed, Kristeen MissPhilip Joleena Weisenburger, MD  02/25/2018, 8:38 AM

## 2018-02-25 NOTE — Anesthesia Preprocedure Evaluation (Addendum)
Anesthesia Evaluation  Patient identified by MRN, date of birth, ID band Patient awake    Reviewed: Allergy & Precautions, NPO status , Patient's Chart, lab work & pertinent test results  History of Anesthesia Complications Negative for: history of anesthetic complications  Airway Mallampati: II  TM Distance: >3 FB Neck ROM: Full   Comment:  Full beard  Dental  (+) Dental Advisory Given, Chipped   Pulmonary neg pulmonary ROS,    breath sounds clear to auscultation       Cardiovascular hypertension, Pt. on home beta blockers and Pt. on medications + CAD and + Past MI  + dysrhythmias Atrial Fibrillation  Rhythm:Regular Rate:Normal   '19 TTE - LV cavity size was normal. EF 40% to 45% with no regional wall motion abnormalities. The aortic root was mildly dilated. Mild TR with mildly dilated RA.   '19 Cath - Ost LM lesion is 80% stenosed. Ramus lesion is 80% stenosed. 1st LPL lesion is 75% stenosed. LPDA lesion is 70% stenosed. Prox LAD to Mid LAD lesion is 80% stenosed. Ost 1st Diag lesion is 80% stenosed. Mid LAD lesion is 75% stenosed. Mid RCA lesion is 100% stenosed. The left ventricular systolic function is normal. LV end diastolic pressure is normal. The left ventricular ejection fraction is 55-65% by visual estimate.  '19 Carotid US - bilateral 1-39% ICAS    Neuro/Psych negative neurological ROS  negative psych ROS   GI/Hepatic negative GI ROS, (+)     substance abuse  alcohol use,   Endo/Other  diabetes  Renal/GU negative Renal ROS  negative genitourinary   Musculoskeletal negative musculoskeletal ROS (+)   Abdominal   Peds  Hematology negative hematology ROS (+)   Anesthesia Other Findings   Reproductive/Obstetrics                            Anesthesia Physical Anesthesia Plan  ASA: IV  Anesthesia Plan: General   Post-op Pain Management:    Induction:  Intravenous  PONV Risk Score and Plan: 3 and Treatment may vary due to age or medical condition  Airway Management Planned: Oral ETT  Additional Equipment: Arterial line, CVP, PA Cath, TEE and Ultrasound Guidance Line Placement  Intra-op Plan:   Post-operative Plan: Post-operative intubation/ventilation  Informed Consent: I have reviewed the patients History and Physical, chart, labs and discussed the procedure including the risks, benefits and alternatives for the proposed anesthesia with the patient or authorized representative who has indicated his/her understanding and acceptance.   Dental advisory given  Plan Discussed with: CRNA and Anesthesiologist  Anesthesia Plan Comments: (Patient with thick beard. Discussed shaving preoperatively, patient reluctant. Willing to allow minor trimming of beard line for line placement only.)       Anesthesia Quick Evaluation

## 2018-02-26 ENCOUNTER — Inpatient Hospital Stay (HOSPITAL_COMMUNITY): Payer: BLUE CROSS/BLUE SHIELD

## 2018-02-26 ENCOUNTER — Inpatient Hospital Stay (HOSPITAL_COMMUNITY): Payer: BLUE CROSS/BLUE SHIELD | Admitting: Certified Registered Nurse Anesthetist

## 2018-02-26 ENCOUNTER — Encounter (HOSPITAL_COMMUNITY)
Admission: EM | Disposition: A | Discharge status: Home / Self Care | Payer: Self-pay | Source: Home / Self Care | Attending: Cardiothoracic Surgery

## 2018-02-26 DIAGNOSIS — Z951 Presence of aortocoronary bypass graft: Secondary | ICD-10-CM

## 2018-02-26 HISTORY — PX: CORONARY ARTERY BYPASS GRAFT: SHX141

## 2018-02-26 HISTORY — PX: TEE WITHOUT CARDIOVERSION: SHX5443

## 2018-02-26 LAB — POCT I-STAT 3, ART BLOOD GAS (G3+)
ACID-BASE DEFICIT: 4 mmol/L — AB (ref 0.0–2.0)
Acid-base deficit: 3 mmol/L — ABNORMAL HIGH (ref 0.0–2.0)
Acid-base deficit: 4 mmol/L — ABNORMAL HIGH (ref 0.0–2.0)
BICARBONATE: 22.1 mmol/L (ref 20.0–28.0)
Bicarbonate: 24.6 mmol/L (ref 20.0–28.0)
Bicarbonate: 22.1 mmol/L (ref 20.0–28.0)
Bicarbonate: 22.3 mmol/L (ref 20.0–28.0)
O2 SAT: 100 %
O2 SAT: 98 %
O2 SAT: 96 %
O2 Saturation: 96 %
PCO2 ART: 37.4 mmHg (ref 32.0–48.0)
PCO2 ART: 37.4 mmHg (ref 32.0–48.0)
PCO2 ART: 44.1 mmHg (ref 32.0–48.0)
PH ART: 7.427 (ref 7.350–7.450)
PH ART: 7.375 (ref 7.350–7.450)
PH ART: 7.309 — AB (ref 7.350–7.450)
PO2 ART: 94 mmHg (ref 83.0–108.0)
PO2 ART: 85 mmHg (ref 83.0–108.0)
Patient temperature: 35.9
Patient temperature: 36.6
TCO2: 26 mmol/L (ref 22–32)
TCO2: 23 mmol/L (ref 22–32)
TCO2: 24 mmol/L (ref 22–32)
TCO2: 23 mmol/L (ref 22–32)
pCO2 arterial: 43.3 mmHg (ref 32.0–48.0)
pH, Arterial: 7.318 — ABNORMAL LOW (ref 7.350–7.450)
pO2, Arterial: 413 mmHg — ABNORMAL HIGH (ref 83.0–108.0)
pO2, Arterial: 94 mmHg (ref 83.0–108.0)

## 2018-02-26 LAB — CBC
HCT: 38.2 % — ABNORMAL LOW (ref 39.0–52.0)
HEMATOCRIT: 40.1 % (ref 39.0–52.0)
HEMATOCRIT: 38.5 % — AB (ref 39.0–52.0)
HEMOGLOBIN: 13 g/dL (ref 13.0–17.0)
HEMOGLOBIN: 12.7 g/dL — AB (ref 13.0–17.0)
Hemoglobin: 12.3 g/dL — ABNORMAL LOW (ref 13.0–17.0)
MCH: 30.6 pg (ref 26.0–34.0)
MCH: 30.6 pg (ref 26.0–34.0)
MCH: 30.2 pg (ref 26.0–34.0)
MCHC: 32.4 g/dL (ref 30.0–36.0)
MCHC: 33 g/dL (ref 30.0–36.0)
MCHC: 32.2 g/dL (ref 30.0–36.0)
MCV: 94.4 fL (ref 78.0–100.0)
MCV: 92.8 fL (ref 78.0–100.0)
MCV: 93.9 fL (ref 78.0–100.0)
Platelets: 203 10*3/uL (ref 150–400)
Platelets: 148 10*3/uL — ABNORMAL LOW (ref 150–400)
Platelets: 163 10*3/uL (ref 150–400)
RBC: 4.25 MIL/uL (ref 4.22–5.81)
RBC: 4.15 MIL/uL — ABNORMAL LOW (ref 4.22–5.81)
RBC: 4.07 MIL/uL — ABNORMAL LOW (ref 4.22–5.81)
RDW: 12.5 % (ref 11.5–15.5)
RDW: 12.5 % (ref 11.5–15.5)
RDW: 12.6 % (ref 11.5–15.5)
WBC: 6.6 10*3/uL (ref 4.0–10.5)
WBC: 15.6 10*3/uL — AB (ref 4.0–10.5)
WBC: 17.5 10*3/uL — ABNORMAL HIGH (ref 4.0–10.5)

## 2018-02-26 LAB — POCT I-STAT, CHEM 8
BUN: 13 mg/dL (ref 8–23)
BUN: 11 mg/dL (ref 8–23)
BUN: 10 mg/dL (ref 8–23)
BUN: 11 mg/dL (ref 8–23)
BUN: 10 mg/dL (ref 8–23)
BUN: 11 mg/dL (ref 8–23)
CALCIUM ION: 1.2 mmol/L (ref 1.15–1.40)
CALCIUM ION: 1.06 mmol/L — AB (ref 1.15–1.40)
CALCIUM ION: 1.2 mmol/L (ref 1.15–1.40)
CHLORIDE: 101 mmol/L (ref 98–111)
CHLORIDE: 102 mmol/L (ref 98–111)
CREATININE: 0.7 mg/dL (ref 0.61–1.24)
CREATININE: 0.9 mg/dL (ref 0.61–1.24)
Calcium, Ion: 1.24 mmol/L (ref 1.15–1.40)
Calcium, Ion: 1.13 mmol/L — ABNORMAL LOW (ref 1.15–1.40)
Calcium, Ion: 1.09 mmol/L — ABNORMAL LOW (ref 1.15–1.40)
Chloride: 103 mmol/L (ref 98–111)
Chloride: 102 mmol/L (ref 98–111)
Chloride: 102 mmol/L (ref 98–111)
Chloride: 106 mmol/L (ref 98–111)
Creatinine, Ser: 0.7 mg/dL (ref 0.61–1.24)
Creatinine, Ser: 0.6 mg/dL — ABNORMAL LOW (ref 0.61–1.24)
Creatinine, Ser: 0.6 mg/dL — ABNORMAL LOW (ref 0.61–1.24)
Creatinine, Ser: 0.7 mg/dL (ref 0.61–1.24)
GLUCOSE: 119 mg/dL — AB (ref 70–99)
GLUCOSE: 152 mg/dL — AB (ref 70–99)
Glucose, Bld: 143 mg/dL — ABNORMAL HIGH (ref 70–99)
Glucose, Bld: 106 mg/dL — ABNORMAL HIGH (ref 70–99)
Glucose, Bld: 135 mg/dL — ABNORMAL HIGH (ref 70–99)
Glucose, Bld: 172 mg/dL — ABNORMAL HIGH (ref 70–99)
HCT: 32 % — ABNORMAL LOW (ref 39.0–52.0)
HCT: 38 % — ABNORMAL LOW (ref 39.0–52.0)
HEMATOCRIT: 37 % — AB (ref 39.0–52.0)
HEMATOCRIT: 26 % — AB (ref 39.0–52.0)
HEMATOCRIT: 27 % — AB (ref 39.0–52.0)
HEMATOCRIT: 27 % — AB (ref 39.0–52.0)
HEMOGLOBIN: 9.2 g/dL — AB (ref 13.0–17.0)
HEMOGLOBIN: 9.2 g/dL — AB (ref 13.0–17.0)
HEMOGLOBIN: 12.9 g/dL — AB (ref 13.0–17.0)
Hemoglobin: 12.6 g/dL — ABNORMAL LOW (ref 13.0–17.0)
Hemoglobin: 10.9 g/dL — ABNORMAL LOW (ref 13.0–17.0)
Hemoglobin: 8.8 g/dL — ABNORMAL LOW (ref 13.0–17.0)
POTASSIUM: 3.4 mmol/L — AB (ref 3.5–5.1)
POTASSIUM: 3.9 mmol/L (ref 3.5–5.1)
Potassium: 4.2 mmol/L (ref 3.5–5.1)
Potassium: 3.8 mmol/L (ref 3.5–5.1)
Potassium: 3.7 mmol/L (ref 3.5–5.1)
Potassium: 3.7 mmol/L (ref 3.5–5.1)
SODIUM: 139 mmol/L (ref 135–145)
SODIUM: 138 mmol/L (ref 135–145)
SODIUM: 140 mmol/L (ref 135–145)
Sodium: 140 mmol/L (ref 135–145)
Sodium: 139 mmol/L (ref 135–145)
Sodium: 139 mmol/L (ref 135–145)
TCO2: 28 mmol/L (ref 22–32)
TCO2: 28 mmol/L (ref 22–32)
TCO2: 27 mmol/L (ref 22–32)
TCO2: 28 mmol/L (ref 22–32)
TCO2: 25 mmol/L (ref 22–32)
TCO2: 23 mmol/L (ref 22–32)

## 2018-02-26 LAB — BASIC METABOLIC PANEL
Anion gap: 15 (ref 5–15)
BUN: 14 mg/dL (ref 8–23)
CO2: 22 mmol/L (ref 22–32)
Calcium: 9.4 mg/dL (ref 8.9–10.3)
Chloride: 102 mmol/L (ref 98–111)
Creatinine, Ser: 1.02 mg/dL (ref 0.61–1.24)
GFR calc Af Amer: >60 mL/min (ref >60)
GFR calc non Af Amer: >60 mL/min (ref >60)
Glucose, Bld: 129 mg/dL — ABNORMAL HIGH (ref 70–99)
Potassium: 3.9 mmol/L (ref 3.5–5.1)
Sodium: 139 mmol/L (ref 135–145)

## 2018-02-26 LAB — POCT I-STAT 4, (NA,K, GLUC, HGB,HCT)
Glucose, Bld: 179 mg/dL — ABNORMAL HIGH (ref 70–99)
HCT: 37 % — ABNORMAL LOW (ref 39.0–52.0)
Hemoglobin: 12.6 g/dL — ABNORMAL LOW (ref 13.0–17.0)
Potassium: 3.5 mmol/L (ref 3.5–5.1)
Sodium: 140 mmol/L (ref 135–145)

## 2018-02-26 LAB — HEMOGLOBIN AND HEMATOCRIT, BLOOD
HCT: 28.4 % — ABNORMAL LOW (ref 39.0–52.0)
Hemoglobin: 9.4 g/dL — ABNORMAL LOW (ref 13.0–17.0)

## 2018-02-26 LAB — APTT: aPTT: 31 seconds (ref 24–36)

## 2018-02-26 LAB — GLUCOSE, CAPILLARY
GLUCOSE-CAPILLARY: 153 mg/dL — AB (ref 70–99)
GLUCOSE-CAPILLARY: 57 mg/dL — AB (ref 70–99)
GLUCOSE-CAPILLARY: 91 mg/dL (ref 70–99)
GLUCOSE-CAPILLARY: 130 mg/dL — AB (ref 70–99)
GLUCOSE-CAPILLARY: 134 mg/dL — AB (ref 70–99)
GLUCOSE-CAPILLARY: 161 mg/dL — AB (ref 70–99)
Glucose-Capillary: 140 mg/dL — ABNORMAL HIGH (ref 70–99)
Glucose-Capillary: 139 mg/dL — ABNORMAL HIGH (ref 70–99)
Glucose-Capillary: 149 mg/dL — ABNORMAL HIGH (ref 70–99)

## 2018-02-26 LAB — CREATININE, SERUM
Creatinine, Ser: 0.96 mg/dL (ref 0.61–1.24)
GFR calc Af Amer: >60 mL/min (ref >60)
GFR calc non Af Amer: >60 mL/min (ref >60)

## 2018-02-26 LAB — HEPARIN LEVEL (UNFRACTIONATED): Heparin Unfractionated: 0.47 IU/mL (ref 0.30–0.70)

## 2018-02-26 LAB — PLATELET COUNT: Platelets: 153 10*3/uL (ref 150–400)

## 2018-02-26 LAB — MAGNESIUM: Magnesium: 3.2 mg/dL — ABNORMAL HIGH (ref 1.7–2.4)

## 2018-02-26 LAB — PROTIME-INR
INR: 1.45
Prothrombin Time: 17.5 seconds — ABNORMAL HIGH (ref 11.4–15.2)

## 2018-02-26 SURGERY — CORONARY ARTERY BYPASS GRAFTING (CABG)
Anesthesia: General | Site: Chest

## 2018-02-26 MED ORDER — METOPROLOL TARTRATE 25 MG/10 ML ORAL SUSPENSION
12.5000 mg | Freq: Two times a day (BID) | ORAL | Status: DC
  Filled 2018-02-26: qty 10

## 2018-02-26 MED ORDER — SODIUM CHLORIDE 0.9 % IV SOLN
INTRAVENOUS | Status: DC
  Administered 2018-02-26: 5 [IU]/h via INTRAVENOUS
  Filled 2018-02-26: qty 1

## 2018-02-26 MED ORDER — AMIODARONE HCL IN DEXTROSE 360-4.14 MG/200ML-% IV SOLN
60.0000 mg/h | INTRAVENOUS | Status: AC
  Administered 2018-02-26 (×2): 60 mg/h via INTRAVENOUS

## 2018-02-26 MED ORDER — ROCURONIUM BROMIDE 10 MG/ML (PF) SYRINGE
PREFILLED_SYRINGE | INTRAVENOUS | Status: AC
  Filled 2018-02-26: qty 20

## 2018-02-26 MED ORDER — HEMOSTATIC AGENTS (NO CHARGE) OPTIME
TOPICAL | Status: DC | PRN
  Administered 2018-02-26 (×2): 1 via TOPICAL

## 2018-02-26 MED ORDER — POTASSIUM CHLORIDE 10 MEQ/50ML IV SOLN
10.0000 meq | INTRAVENOUS | Status: AC
  Administered 2018-02-26 (×3): 10 meq via INTRAVENOUS

## 2018-02-26 MED ORDER — SODIUM CHLORIDE 0.45 % IV SOLN
INTRAVENOUS | Status: DC | PRN
  Administered 2018-02-26: 1 mL via INTRAVENOUS
  Administered 2018-02-27: 35 mL via INTRAVENOUS

## 2018-02-26 MED ORDER — OXYCODONE HCL 5 MG PO TABS: 5.0000 mg | ORAL_TABLET | ORAL | Status: DC | PRN

## 2018-02-26 MED ORDER — DOPAMINE-DEXTROSE 3.2-5 MG/ML-% IV SOLN: 0.0000 ug/kg/min | INTRAVENOUS | Status: DC

## 2018-02-26 MED ORDER — 0.9 % SODIUM CHLORIDE (POUR BTL) OPTIME
TOPICAL | Status: DC | PRN
  Administered 2018-02-26: 6000 mL

## 2018-02-26 MED ORDER — DEXMEDETOMIDINE HCL IN NACL 200 MCG/50ML IV SOLN
INTRAVENOUS | Status: AC
  Filled 2018-02-26: qty 50

## 2018-02-26 MED ORDER — ALBUMIN HUMAN 5 % IV SOLN
INTRAVENOUS | Status: DC | PRN
  Administered 2018-02-26: 13:00:00 via INTRAVENOUS

## 2018-02-26 MED ORDER — ASPIRIN 81 MG PO CHEW: 324.0000 mg | CHEWABLE_TABLET | Freq: Every day | ORAL | Status: DC

## 2018-02-26 MED ORDER — VANCOMYCIN HCL IN DEXTROSE 1-5 GM/200ML-% IV SOLN
1000.0000 mg | Freq: Once | INTRAVENOUS | Status: AC
  Administered 2018-02-26: 1000 mg via INTRAVENOUS
  Filled 2018-02-26: qty 200

## 2018-02-26 MED ORDER — LACTATED RINGERS IV SOLN
INTRAVENOUS | Status: DC
  Administered 2018-02-26: 1 mL via INTRAVENOUS

## 2018-02-26 MED ORDER — INSULIN REGULAR BOLUS VIA INFUSION
0.0000 [IU] | Freq: Three times a day (TID) | INTRAVENOUS | Status: DC
  Filled 2018-02-26: qty 10

## 2018-02-26 MED ORDER — LACTATED RINGERS IV SOLN: INTRAVENOUS | Status: DC

## 2018-02-26 MED ORDER — BISACODYL 10 MG RE SUPP: 10.0000 mg | Freq: Every day | RECTAL | Status: DC

## 2018-02-26 MED ORDER — MIDAZOLAM HCL 2 MG/2ML IJ SOLN
INTRAMUSCULAR | Status: AC
  Filled 2018-02-26: qty 2

## 2018-02-26 MED ORDER — MILRINONE LACTATE IN DEXTROSE 20-5 MG/100ML-% IV SOLN
INTRAVENOUS | Status: DC | PRN
  Administered 2018-02-26: .3 ug/kg/min via INTRAVENOUS

## 2018-02-26 MED ORDER — AMIODARONE IV BOLUS ONLY 150 MG/100ML
INTRAVENOUS | Status: DC | PRN
  Administered 2018-02-26: 150 mg via INTRAVENOUS

## 2018-02-26 MED ORDER — HEPARIN SODIUM (PORCINE) 1000 UNIT/ML IJ SOLN
INTRAMUSCULAR | Status: DC | PRN
  Administered 2018-02-26: 30000 [IU] via INTRAVENOUS

## 2018-02-26 MED ORDER — ACETAMINOPHEN 160 MG/5ML PO SOLN: 650.0000 mg | Freq: Once | ORAL | Status: AC

## 2018-02-26 MED ORDER — PROPOFOL 10 MG/ML IV BOLUS
INTRAVENOUS | Status: DC | PRN
  Administered 2018-02-26 (×2): 40 mg via INTRAVENOUS
  Administered 2018-02-26: 30 mg via INTRAVENOUS

## 2018-02-26 MED ORDER — CHLORHEXIDINE GLUCONATE 0.12 % MT SOLN: 15.0000 mL | Freq: Two times a day (BID) | OROMUCOSAL | Status: DC

## 2018-02-26 MED ORDER — SODIUM CHLORIDE 0.9 % IV SOLN
INTRAVENOUS | Status: DC | PRN
  Administered 2018-02-26: 13:00:00 via INTRAVENOUS

## 2018-02-26 MED ORDER — PHENYLEPHRINE 40 MCG/ML (10ML) SYRINGE FOR IV PUSH (FOR BLOOD PRESSURE SUPPORT)
PREFILLED_SYRINGE | INTRAVENOUS | Status: DC | PRN
  Administered 2018-02-26 (×4): 40 ug via INTRAVENOUS

## 2018-02-26 MED ORDER — PANTOPRAZOLE SODIUM 40 MG PO TBEC
40.0000 mg | DELAYED_RELEASE_TABLET | Freq: Every day | ORAL | Status: DC
  Administered 2018-02-28 – 2018-03-03 (×4): 40 mg via ORAL
  Filled 2018-02-26 (×4): qty 1

## 2018-02-26 MED ORDER — DOCUSATE SODIUM 100 MG PO CAPS
200.0000 mg | ORAL_CAPSULE | Freq: Every day | ORAL | Status: DC
  Administered 2018-02-27 – 2018-03-03 (×5): 200 mg via ORAL
  Filled 2018-02-26 (×4): qty 2

## 2018-02-26 MED ORDER — SODIUM CHLORIDE 0.9 % IV SOLN
INTRAVENOUS | Status: DC | PRN
  Administered 2018-02-26: 15 ug/min via INTRAVENOUS

## 2018-02-26 MED ORDER — ACETAMINOPHEN 160 MG/5ML PO SOLN: 1000.0000 mg | Freq: Four times a day (QID) | ORAL | Status: DC

## 2018-02-26 MED ORDER — ASPIRIN EC 325 MG PO TBEC
325.0000 mg | DELAYED_RELEASE_TABLET | Freq: Every day | ORAL | Status: DC
  Administered 2018-02-27 – 2018-03-03 (×5): 325 mg via ORAL
  Filled 2018-02-26 (×5): qty 1

## 2018-02-26 MED ORDER — CHLORHEXIDINE GLUCONATE 0.12 % MT SOLN
15.0000 mL | OROMUCOSAL | Status: AC
  Administered 2018-02-26: 15 mL via OROMUCOSAL

## 2018-02-26 MED ORDER — LACTATED RINGERS IV SOLN
INTRAVENOUS | Status: DC | PRN
  Administered 2018-02-26: 07:00:00 via INTRAVENOUS

## 2018-02-26 MED ORDER — MORPHINE SULFATE (PF) 2 MG/ML IV SOLN
1.0000 mg | INTRAVENOUS | Status: AC | PRN
  Administered 2018-02-26: 1 mg via INTRAVENOUS
  Filled 2018-02-26: qty 1

## 2018-02-26 MED ORDER — PROTAMINE SULFATE 10 MG/ML IV SOLN
INTRAVENOUS | Status: AC
  Filled 2018-02-26: qty 25

## 2018-02-26 MED ORDER — MIDAZOLAM HCL 2 MG/2ML IJ SOLN: 2.0000 mg | INTRAMUSCULAR | Status: DC | PRN

## 2018-02-26 MED ORDER — SODIUM CHLORIDE 0.9 % IV SOLN
INTRAVENOUS | Status: DC | PRN
  Administered 2018-02-26: 750 mg via INTRAVENOUS

## 2018-02-26 MED ORDER — ONDANSETRON HCL 4 MG/2ML IJ SOLN
4.0000 mg | Freq: Four times a day (QID) | INTRAMUSCULAR | Status: DC | PRN
  Administered 2018-02-27: 4 mg via INTRAVENOUS
  Filled 2018-02-26: qty 2

## 2018-02-26 MED ORDER — MIDAZOLAM HCL 10 MG/2ML IJ SOLN
INTRAMUSCULAR | Status: AC
  Filled 2018-02-26: qty 2

## 2018-02-26 MED ORDER — ALBUMIN HUMAN 5 % IV SOLN
250.0000 mL | INTRAVENOUS | Status: AC | PRN
  Administered 2018-02-26 – 2018-02-27 (×4): 250 mL via INTRAVENOUS
  Filled 2018-02-26 (×2): qty 250

## 2018-02-26 MED ORDER — DEXMEDETOMIDINE HCL IN NACL 400 MCG/100ML IV SOLN
0.0000 ug/kg/h | INTRAVENOUS | Status: DC
  Administered 2018-02-26: 0.7 ug/kg/h via INTRAVENOUS

## 2018-02-26 MED ORDER — PROTAMINE SULFATE 10 MG/ML IV SOLN
INTRAVENOUS | Status: AC
  Filled 2018-02-26: qty 5

## 2018-02-26 MED ORDER — SODIUM CHLORIDE 0.9 % IV SOLN
INTRAVENOUS | Status: DC
  Administered 2018-02-26: 1 mL via INTRAVENOUS

## 2018-02-26 MED ORDER — SUCCINYLCHOLINE CHLORIDE 20 MG/ML IJ SOLN
INTRAMUSCULAR | Status: DC | PRN
  Administered 2018-02-26: 100 mg via INTRAVENOUS

## 2018-02-26 MED ORDER — PHENYLEPHRINE HCL-NACL 20-0.9 MG/250ML-% IV SOLN
0.0000 ug/min | INTRAVENOUS | Status: DC
  Administered 2018-02-26: 10 ug/min via INTRAVENOUS
  Administered 2018-02-27: 15 ug/min via INTRAVENOUS
  Filled 2018-02-26 (×2): qty 250

## 2018-02-26 MED ORDER — HEPARIN SODIUM (PORCINE) 1000 UNIT/ML IJ SOLN
INTRAMUSCULAR | Status: AC
  Filled 2018-02-26: qty 1

## 2018-02-26 MED ORDER — SODIUM CHLORIDE 0.9% FLUSH: 3.0000 mL | INTRAVENOUS | Status: DC | PRN

## 2018-02-26 MED ORDER — VASOPRESSIN 20 UNIT/ML IV SOLN
INTRAVENOUS | Status: AC
  Filled 2018-02-26: qty 1

## 2018-02-26 MED ORDER — LIDOCAINE 2% (20 MG/ML) 5 ML SYRINGE
INTRAMUSCULAR | Status: DC | PRN
  Administered 2018-02-26: 50 mg via INTRAVENOUS

## 2018-02-26 MED ORDER — ARTIFICIAL TEARS OPHTHALMIC OINT
TOPICAL_OINTMENT | OPHTHALMIC | Status: AC
  Filled 2018-02-26: qty 3.5

## 2018-02-26 MED ORDER — ATORVASTATIN CALCIUM 20 MG PO TABS
20.0000 mg | ORAL_TABLET | Freq: Every day | ORAL | Status: DC
  Administered 2018-02-27 – 2018-03-02 (×4): 20 mg via ORAL
  Filled 2018-02-26 (×4): qty 1

## 2018-02-26 MED ORDER — LACTATED RINGERS IV SOLN: 500.0000 mL | Freq: Once | INTRAVENOUS | Status: DC | PRN

## 2018-02-26 MED ORDER — SODIUM CHLORIDE 0.9% FLUSH
3.0000 mL | Freq: Two times a day (BID) | INTRAVENOUS | Status: DC
  Administered 2018-02-27 – 2018-03-02 (×6): 3 mL via INTRAVENOUS

## 2018-02-26 MED ORDER — ORAL CARE MOUTH RINSE: 15.0000 mL | Freq: Two times a day (BID) | OROMUCOSAL | Status: DC

## 2018-02-26 MED ORDER — METOPROLOL TARTRATE 5 MG/5ML IV SOLN: 2.5000 mg | INTRAVENOUS | Status: DC | PRN

## 2018-02-26 MED ORDER — FENTANYL CITRATE (PF) 100 MCG/2ML IJ SOLN
INTRAMUSCULAR | Status: DC | PRN
  Administered 2018-02-26: 250 ug via INTRAVENOUS
  Administered 2018-02-26: 25 ug via INTRAVENOUS
  Administered 2018-02-26 (×2): 250 ug via INTRAVENOUS
  Administered 2018-02-26: 225 ug via INTRAVENOUS
  Administered 2018-02-26: 250 ug via INTRAVENOUS

## 2018-02-26 MED ORDER — AMIODARONE HCL IN DEXTROSE 360-4.14 MG/200ML-% IV SOLN
30.0000 mg/h | INTRAVENOUS | Status: DC
  Administered 2018-02-27: 30 mg/h via INTRAVENOUS
  Filled 2018-02-26: qty 200

## 2018-02-26 MED ORDER — AMIODARONE IV BOLUS ONLY 150 MG/100ML: INTRAVENOUS | Status: DC | PRN

## 2018-02-26 MED ORDER — ACETAMINOPHEN 650 MG RE SUPP
650.0000 mg | Freq: Once | RECTAL | Status: AC
  Administered 2018-02-26: 650 mg via RECTAL

## 2018-02-26 MED ORDER — SODIUM CHLORIDE 0.9 % IJ SOLN
OROMUCOSAL | Status: DC | PRN
  Administered 2018-02-26 (×3): 4 mL via TOPICAL

## 2018-02-26 MED ORDER — FENTANYL CITRATE (PF) 250 MCG/5ML IJ SOLN
INTRAMUSCULAR | Status: AC
  Filled 2018-02-26: qty 25

## 2018-02-26 MED ORDER — SODIUM CHLORIDE 0.9 % IV SOLN: 250.0000 mL | INTRAVENOUS | Status: DC

## 2018-02-26 MED ORDER — BISACODYL 5 MG PO TBEC
10.0000 mg | DELAYED_RELEASE_TABLET | Freq: Every day | ORAL | Status: DC
  Administered 2018-02-27 – 2018-03-02 (×4): 10 mg via ORAL
  Filled 2018-02-26 (×4): qty 2

## 2018-02-26 MED ORDER — FAMOTIDINE IN NACL 20-0.9 MG/50ML-% IV SOLN
20.0000 mg | Freq: Two times a day (BID) | INTRAVENOUS | Status: DC
  Administered 2018-02-26: 20 mg via INTRAVENOUS

## 2018-02-26 MED ORDER — ACETAMINOPHEN 500 MG PO TABS
1000.0000 mg | ORAL_TABLET | Freq: Four times a day (QID) | ORAL | Status: DC
  Administered 2018-02-26 – 2018-03-01 (×11): 1000 mg via ORAL
  Administered 2018-03-02: 500 mg via ORAL
  Administered 2018-03-02 – 2018-03-03 (×4): 1000 mg via ORAL
  Filled 2018-02-26 (×16): qty 2

## 2018-02-26 MED ORDER — AMIODARONE HCL IN DEXTROSE 360-4.14 MG/200ML-% IV SOLN
60.0000 mg/h | INTRAVENOUS | Status: AC
  Administered 2018-02-26: 60 mg/h via INTRAVENOUS
  Filled 2018-02-26: qty 200

## 2018-02-26 MED ORDER — AMIODARONE HCL IN DEXTROSE 360-4.14 MG/200ML-% IV SOLN
60.0000 mg/h | INTRAVENOUS | Status: DC
  Filled 2018-02-26: qty 200

## 2018-02-26 MED ORDER — MAGNESIUM SULFATE 4 GM/100ML IV SOLN
4.0000 g | Freq: Once | INTRAVENOUS | Status: AC
  Administered 2018-02-26: 4 g via INTRAVENOUS
  Filled 2018-02-26: qty 100

## 2018-02-26 MED ORDER — METOPROLOL TARTRATE 12.5 MG HALF TABLET
12.5000 mg | ORAL_TABLET | Freq: Two times a day (BID) | ORAL | Status: DC
  Administered 2018-02-27 – 2018-03-03 (×7): 12.5 mg via ORAL
  Filled 2018-02-26 (×8): qty 1

## 2018-02-26 MED ORDER — ORAL CARE MOUTH RINSE
15.0000 mL | Freq: Two times a day (BID) | OROMUCOSAL | Status: DC
  Administered 2018-02-26 – 2018-03-03 (×6): 15 mL via OROMUCOSAL

## 2018-02-26 MED ORDER — MIDAZOLAM HCL 5 MG/5ML IJ SOLN
INTRAMUSCULAR | Status: DC | PRN
  Administered 2018-02-26: 4 mg via INTRAVENOUS
  Administered 2018-02-26: 1 mg via INTRAVENOUS
  Administered 2018-02-26: 2 mg via INTRAVENOUS
  Administered 2018-02-26: 4 mg via INTRAVENOUS
  Administered 2018-02-26 (×2): 2 mg via INTRAVENOUS
  Administered 2018-02-26: 1 mg via INTRAVENOUS

## 2018-02-26 MED ORDER — TRAMADOL HCL 50 MG PO TABS
50.0000 mg | ORAL_TABLET | ORAL | Status: DC | PRN
  Administered 2018-02-28 – 2018-03-03 (×6): 50 mg via ORAL
  Filled 2018-02-26 (×6): qty 1

## 2018-02-26 MED ORDER — PHENYLEPHRINE 40 MCG/ML (10ML) SYRINGE FOR IV PUSH (FOR BLOOD PRESSURE SUPPORT)
PREFILLED_SYRINGE | INTRAVENOUS | Status: AC
  Filled 2018-02-26: qty 10

## 2018-02-26 MED ORDER — MORPHINE SULFATE (PF) 2 MG/ML IV SOLN
2.0000 mg | INTRAVENOUS | Status: DC | PRN
  Administered 2018-02-27 – 2018-02-28 (×7): 2 mg via INTRAVENOUS
  Filled 2018-02-26 (×7): qty 1

## 2018-02-26 MED ORDER — NITROGLYCERIN IN D5W 200-5 MCG/ML-% IV SOLN: 0.0000 ug/min | INTRAVENOUS | Status: DC

## 2018-02-26 MED ORDER — DOPAMINE-DEXTROSE 1.6-5 MG/ML-% IV SOLN
INTRAVENOUS | Status: DC | PRN
  Administered 2018-02-26: 5 ug/kg/min via INTRAVENOUS

## 2018-02-26 MED ORDER — SUCCINYLCHOLINE CHLORIDE 200 MG/10ML IV SOSY
PREFILLED_SYRINGE | INTRAVENOUS | Status: AC
  Filled 2018-02-26: qty 10

## 2018-02-26 MED ORDER — MILRINONE LACTATE IN DEXTROSE 20-5 MG/100ML-% IV SOLN
0.3000 ug/kg/min | INTRAVENOUS | Status: DC
  Administered 2018-02-26: 0.3 ug/kg/min via INTRAVENOUS
  Administered 2018-02-27: 0.125 ug/kg/min via INTRAVENOUS
  Filled 2018-02-26 (×2): qty 100

## 2018-02-26 MED ORDER — GLYCOPYRROLATE PF 0.2 MG/ML IJ SOSY
PREFILLED_SYRINGE | INTRAMUSCULAR | Status: AC
  Filled 2018-02-26: qty 1

## 2018-02-26 MED ORDER — SODIUM CHLORIDE 0.9 % IV SOLN
1.5000 g | Freq: Two times a day (BID) | INTRAVENOUS | Status: AC
  Administered 2018-02-26 – 2018-02-28 (×4): 1.5 g via INTRAVENOUS
  Filled 2018-02-26 (×4): qty 1.5

## 2018-02-26 MED ORDER — SODIUM BICARBONATE 8.4 % IV SOLN
25.0000 meq | Freq: Once | INTRAVENOUS | Status: AC
  Administered 2018-02-26: 25 meq via INTRAVENOUS

## 2018-02-26 MED ORDER — ROCURONIUM BROMIDE 10 MG/ML (PF) SYRINGE
PREFILLED_SYRINGE | INTRAVENOUS | Status: DC | PRN
  Administered 2018-02-26 (×2): 100 mg via INTRAVENOUS

## 2018-02-26 MED ORDER — PROTAMINE SULFATE 10 MG/ML IV SOLN
INTRAVENOUS | Status: DC | PRN
  Administered 2018-02-26: 290 mg via INTRAVENOUS

## 2018-02-26 SURGICAL SUPPLY — 67 items
BAG DECANTER FOR FLEXI CONT (MISCELLANEOUS) ×3 IMPLANT
BANDAGE ACE 4X5 VEL STRL LF (GAUZE/BANDAGES/DRESSINGS) ×6 IMPLANT
BANDAGE ACE 6X5 VEL STRL LF (GAUZE/BANDAGES/DRESSINGS) ×6 IMPLANT
BLADE STERNUM SYSTEM 6 (BLADE) ×3 IMPLANT
BLADE SURG 11 STRL SS (BLADE) ×3 IMPLANT
BNDG GAUZE ELAST 4 BULKY (GAUZE/BANDAGES/DRESSINGS) ×6 IMPLANT
CANISTER SUCT 3000ML PPV (MISCELLANEOUS) ×3 IMPLANT
CATH CPB KIT GERHARDT (MISCELLANEOUS) ×3 IMPLANT
CATH THORACIC 28FR (CATHETERS) ×3 IMPLANT
CRADLE DONUT ADULT HEAD (MISCELLANEOUS) ×3 IMPLANT
DERMABOND ADVANCED (GAUZE/BANDAGES/DRESSINGS) ×2
DERMABOND ADVANCED .7 DNX12 (GAUZE/BANDAGES/DRESSINGS) ×4 IMPLANT
DRAIN CHANNEL 28F RND 3/8 FF (WOUND CARE) ×3 IMPLANT
DRAPE CARDIOVASCULAR INCISE (DRAPES) ×1
DRAPE SLUSH/WARMER DISC (DRAPES) ×3 IMPLANT
DRAPE SRG 135X102X78XABS (DRAPES) ×2 IMPLANT
DRSG AQUACEL AG ADV 3.5X14 (GAUZE/BANDAGES/DRESSINGS) ×3 IMPLANT
ELECT BLADE 4.0 EZ CLEAN MEGAD (MISCELLANEOUS) ×3
ELECT REM PT RETURN 9FT ADLT (ELECTROSURGICAL) ×6
ELECTRODE BLDE 4.0 EZ CLN MEGD (MISCELLANEOUS) ×2 IMPLANT
ELECTRODE REM PT RTRN 9FT ADLT (ELECTROSURGICAL) ×4 IMPLANT
FELT TEFLON 1X6 (MISCELLANEOUS) ×3 IMPLANT
FLOSEAL 5ML (HEMOSTASIS) ×3 IMPLANT
GAUZE SPONGE 4X4 12PLY STRL (GAUZE/BANDAGES/DRESSINGS) ×9 IMPLANT
GLOVE BIO SURGEON STRL SZ 6.5 (GLOVE) ×15 IMPLANT
GOWN STRL REUS W/ TWL LRG LVL3 (GOWN DISPOSABLE) ×16 IMPLANT
GOWN STRL REUS W/TWL LRG LVL3 (GOWN DISPOSABLE) ×8
HEMOSTAT POWDER SURGIFOAM 1G (HEMOSTASIS) ×9 IMPLANT
HEMOSTAT SURGICEL 2X14 (HEMOSTASIS) ×3 IMPLANT
KIT BASIN OR (CUSTOM PROCEDURE TRAY) ×3 IMPLANT
KIT CATH SUCT 8FR (CATHETERS) ×3 IMPLANT
KIT SUCTION CATH 14FR (SUCTIONS) ×6 IMPLANT
KIT TURNOVER KIT B (KITS) ×3 IMPLANT
KIT VASOVIEW HEMOPRO VH 3000 (KITS) ×3 IMPLANT
LEAD PACING MYOCARDI (MISCELLANEOUS) ×3 IMPLANT
MARKER GRAFT CORONARY BYPASS (MISCELLANEOUS) ×9 IMPLANT
NS IRRIG 1000ML POUR BTL (IV SOLUTION) ×18 IMPLANT
PACK E OPEN HEART (SUTURE) ×3 IMPLANT
PACK OPEN HEART (CUSTOM PROCEDURE TRAY) ×3 IMPLANT
PAD ARMBOARD 7.5X6 YLW CONV (MISCELLANEOUS) ×6 IMPLANT
PAD ELECT DEFIB RADIOL ZOLL (MISCELLANEOUS) ×3 IMPLANT
PENCIL BUTTON HOLSTER BLD 10FT (ELECTRODE) ×3 IMPLANT
PUNCH AORTIC ROT 4.0MM RCL 40 (MISCELLANEOUS) ×3 IMPLANT
PUNCH AORTIC ROTATE  4.5MM 8IN (MISCELLANEOUS) ×3 IMPLANT
SET CARDIOPLEGIA MPS 5001102 (MISCELLANEOUS) ×3 IMPLANT
SPONGE LAP 18X18 RF (DISPOSABLE) ×6 IMPLANT
SUT BONE WAX W31G (SUTURE) ×3 IMPLANT
SUT MNCRL AB 4-0 PS2 18 (SUTURE) ×6 IMPLANT
SUT PROLENE 3 0 SH1 36 (SUTURE) ×3 IMPLANT
SUT PROLENE 4 0 TF (SUTURE) ×6 IMPLANT
SUT PROLENE 6 0 CC (SUTURE) ×9 IMPLANT
SUT PROLENE 7 0 BV1 MDA (SUTURE) ×9 IMPLANT
SUT PROLENE 8 0 BV175 6 (SUTURE) ×9 IMPLANT
SUT STEEL 6MS V (SUTURE) ×3 IMPLANT
SUT STEEL SZ 6 DBL 3X14 BALL (SUTURE) ×3 IMPLANT
SUT VIC AB 1 CTX 18 (SUTURE) ×6 IMPLANT
SUT VIC AB 2-0 CT1 27 (SUTURE) ×2
SUT VIC AB 2-0 CT1 TAPERPNT 27 (SUTURE) ×4 IMPLANT
SYSTEM SAHARA CHEST DRAIN ATS (WOUND CARE) ×3 IMPLANT
TOWEL GREEN STERILE (TOWEL DISPOSABLE) ×3 IMPLANT
TOWEL GREEN STERILE FF (TOWEL DISPOSABLE) ×3 IMPLANT
TRAY FOLEY SLVR 16FR TEMP STAT (SET/KITS/TRAYS/PACK) ×3 IMPLANT
TUBE CONNECTING 12X1/4 (SUCTIONS) ×3 IMPLANT
TUBING INSUFFLATION (TUBING) ×3 IMPLANT
UNDERPAD 30X30 (UNDERPADS AND DIAPERS) ×3 IMPLANT
WATER STERILE IRR 1000ML POUR (IV SOLUTION) ×6 IMPLANT
YANKAUER SUCT BULB TIP NO VENT (SUCTIONS) ×3 IMPLANT

## 2018-02-26 NOTE — Progress Notes (Signed)
  Echocardiogram 2D Echocardiogram has been performed.  Belva ChimesWendy  Saloma Cadena 02/26/2018, 7:52 AM

## 2018-02-26 NOTE — Progress Notes (Signed)
Performed NIF and VC.  NIF acceptable @ -20.  VC marginal @ 350 mL 1st attempt and 450 mL second.  RN and RT agree to allow pt more time on vent and make a third attempt.  Will pass all info to night shift RT.

## 2018-02-26 NOTE — OR Nursing (Signed)
[facility] to [facility] charge nurse at 1321. Spoke to KelliherSue. Cath Lab also notified of timing.

## 2018-02-26 NOTE — OR Nursing (Signed)
First call to 2H at 1308. Spoke to PocahontasKeisha.

## 2018-02-26 NOTE — Anesthesia Postprocedure Evaluation (Signed)
Anesthesia Post Note  Patient: Shane Yates  Procedure(s) Performed: CORONARY ARTERY BYPASS GRAFTING (CABG), ON PUMP, TIMES FOUR, USING LEFT INTERNAL MAMMARY ARTERY AND ENDOSCOPICALLY HARVESTED BILATERAL GREATER SAPHENOUS VEINS (N/A Chest) TRANSESOPHAGEAL ECHOCARDIOGRAM (TEE) (N/A )     Patient location during evaluation: ICU Anesthesia Type: General Level of consciousness: sedated and patient remains intubated per anesthesia plan Pain management: pain level controlled Vital Signs Assessment: post-procedure vital signs reviewed and stable Respiratory status: patient remains intubated per anesthesia plan Cardiovascular status: stable (Remains on vasopressors at this time) Postop Assessment: no apparent nausea or vomiting Anesthetic complications: no    Last Vitals:  Vitals:   02/26/18 1430 02/26/18 1445  BP:    Pulse: 94 94  Resp: 12 14  Temp: (!) 35.9 C (!) 35.9 C  SpO2: 100% 100%    Last Pain:  Vitals:   02/26/18 0400  TempSrc: Oral  PainSc: Asleep                 Beryle Lathehomas E Brock

## 2018-02-26 NOTE — Anesthesia Procedure Notes (Signed)
Arterial Line Insertion Start/End8/03/2018 6:45 AM, 02/26/2018 6:50 AM Performed by: Beryle LatheBrock, Thomas E, MD, Ponciano OrtBrewer, Malik Ruffino, CRNA, CRNA  Patient location: Pre-op. Preanesthetic checklist: patient identified, IV checked, site marked, risks and benefits discussed, surgical consent, monitors and equipment checked, pre-op evaluation and anesthesia consent radial was placed Catheter size: 20 G Hand hygiene performed , maximum sterile barriers used  and Seldinger technique used Allen's test indicative of satisfactory collateral circulation Attempts: 1 Procedure performed without using ultrasound guided technique. Ultrasound Notes:anatomy identified, needle tip was noted to be adjacent to the nerve/plexus identified and no ultrasound evidence of intravascular and/or intraneural injection Following insertion, dressing applied and Biopatch. Post procedure assessment: normal  Patient tolerated the procedure well with no immediate complications.

## 2018-02-26 NOTE — Brief Op Note (Addendum)
02/20/2018 - 02/26/2018  11:54 AM  PATIENT:  Shane Yates  72 y.o. male  PRE-OPERATIVE DIAGNOSIS: 1. NSTEMI 2. CAD  POST-OPERATIVE DIAGNOSIS:    1. NSTEMI 2. CAD  PROCEDURE:  TRANSESOPHAGEAL ECHOCARDIOGRAM (TEE), MEDIAN STERNOTOMY for CORONARY ARTERY BYPASS GRAFTING (CABG) x 4 (LIMA to LAD, SVG SEQUENTIALLY to DISTAL CIRCUMFLEX and RAMUS INTERMEDIATE, and SVG to PDA), ON PUMP, TIMES FOUR, USING LEFT INTERNAL MAMMARY ARTERY AND ENDOSCOPICALLY HARVESTED and  BILATERAL GREATER SAPHENOUS VEINS    SURGEON:  Surgeon(s) and Role:    Delight OvensGerhardt, Rodolfo Gaster B, MD - Primary  PHYSICIAN ASSISTANT: Doree Fudgeonielle Zimmerman PA-C  ASSISTANTS: Alvira MondayAlyssa Krasinksi RNFA  ANESTHESIA:   general  EBL:  As recorded by anesthesia and perfusion   DRAINS: Chest tubes placed in the mediastinal and pleural spaces   COUNTS CORRECT:  YES  DICTATION: .Dragon Dictation  PLAN OF CARE: Admit to inpatient   PATIENT DISPOSITION:  ICU - intubated and hemodynamically stable.   Delay start of Pharmacological VTE agent (>24hrs) due to surgical blood loss or risk of bleeding: yes  BASELINE WEIGHT: 81 kg

## 2018-02-26 NOTE — Anesthesia Procedure Notes (Signed)
Procedure Name: Intubation Date/Time: 02/26/2018 7:53 AM Performed by: Cleda Daub, CRNA Pre-anesthesia Checklist: Patient identified, Emergency Drugs available, Suction available and Patient being monitored Patient Re-evaluated:Patient Re-evaluated prior to induction Oxygen Delivery Method: Circle system utilized Preoxygenation: Pre-oxygenation with 100% oxygen Induction Type: IV induction Ventilation: Two handed mask ventilation required and Oral airway inserted - appropriate to patient size Laryngoscope Size: Mac and 3 Grade View: Grade II Tube type: Subglottic suction tube Tube size: 8.0 mm Number of attempts: 1 Airway Equipment and Method: Stylet Placement Confirmation: ETT inserted through vocal cords under direct vision,  positive ETCO2 and breath sounds checked- equal and bilateral Secured at: 23 (at the teeth. ) cm Tube secured with: Tape Dental Injury: Teeth and Oropharynx as per pre-operative assessment

## 2018-02-26 NOTE — Progress Notes (Signed)
CT surgery p.m. Rounds  Doing well after multivessel CABG Hemodynamic stable Atrially paced at 88/min Remains too sedated for ventilator wean and extubation

## 2018-02-26 NOTE — Anesthesia Procedure Notes (Signed)
Central Venous Catheter Insertion Performed by: Beryle LatheBrock, Thomas E, MD, anesthesiologist Start/End8/03/2018 7:01 AM, 02/26/2018 7:14 AM Patient location: Pre-op. Preanesthetic checklist: patient identified, IV checked, site marked, risks and benefits discussed, surgical consent, monitors and equipment checked, pre-op evaluation, timeout performed and anesthesia consent Position: Trendelenburg Lidocaine 1% used for infiltration and patient sedated Hand hygiene performed , maximum sterile barriers used  and Seldinger technique used Catheter size: 8.5 Fr Total catheter length 10. Central line was placed.Sheath introducer Swan type:thermodilution PA Cath depth:47 Procedure performed using ultrasound guided technique. Ultrasound Notes:anatomy identified, needle tip was noted to be adjacent to the nerve/plexus identified, no ultrasound evidence of intravascular and/or intraneural injection and image(s) printed for medical record Attempts: 2 Following insertion, line sutured, dressing applied and Biopatch. Post procedure assessment: blood return through all ports, free fluid flow and no air  Patient tolerated the procedure well with no immediate complications. Additional procedure comments: Best attempts made at keeping beard out of sterile field. Majority of beard covered by sterile drape, rest covered by sterile gauze..Marland Kitchen

## 2018-02-26 NOTE — Transfer of Care (Signed)
Immediate Anesthesia Transfer of Care Note  Patient: Shane Yates  Procedure(s) Performed: CORONARY ARTERY BYPASS GRAFTING (CABG), ON PUMP, TIMES FOUR, USING LEFT INTERNAL MAMMARY ARTERY AND ENDOSCOPICALLY HARVESTED BILATERAL GREATER SAPHENOUS VEINS (N/A Chest) TRANSESOPHAGEAL ECHOCARDIOGRAM (TEE) (N/A )  Patient Location: ICU  Anesthesia Type:General  Level of Consciousness: Patient remains intubated per anesthesia plan  Airway & Oxygen Therapy: Patient remains intubated per anesthesia plan and Patient placed on Ventilator (see vital sign flow sheet for setting)  Post-op Assessment: Report given to RN and Post -op Vital signs reviewed and stable  Post vital signs: Reviewed and stable  Last Vitals:  Vitals Value Taken Time  BP 104/46 02/26/2018  2:00 PM  Temp 36.1 C 02/26/2018  2:12 PM  Pulse 93 02/26/2018  2:12 PM  Resp 12 02/26/2018  2:12 PM  SpO2 98 % 02/26/2018  2:12 PM  Vitals shown include unvalidated device data.  Last Pain:  Vitals:   02/26/18 0400  TempSrc: Oral  PainSc: Asleep      Patients Stated Pain Goal: 0 (02/23/18 1600)  Complications: No apparent anesthesia complications

## 2018-02-26 NOTE — Progress Notes (Signed)
Initiated Open Heart Rapid Wean per Protocol 

## 2018-02-26 NOTE — Plan of Care (Signed)
  Problem: Clinical Measurements: Goal: Ability to maintain clinical measurements within normal limits will improve Outcome: Progressing Goal: Will remain free from infection Outcome: Progressing Goal: Respiratory complications will improve Outcome: Progressing Goal: Cardiovascular complication will be avoided Outcome: Progressing   Problem: Activity: Goal: Risk for activity intolerance will decrease Outcome: Progressing   Problem: Elimination: Goal: Will not experience complications related to urinary retention Outcome: Progressing   Problem: Skin Integrity: Goal: Risk for impaired skin integrity will decrease Outcome: Progressing

## 2018-02-26 NOTE — Anesthesia Procedure Notes (Signed)
Central Venous Catheter Insertion Performed by: Beryle LatheBrock, Leeah Politano E, MD, anesthesiologist Start/End8/03/2018 7:10 AM, 02/26/2018 7:14 AM Patient location: Pre-op. Preanesthetic checklist: patient identified, IV checked, site marked, risks and benefits discussed, surgical consent, monitors and equipment checked, pre-op evaluation, timeout performed and anesthesia consent Hand hygiene performed  and maximum sterile barriers used  Total catheter length 10. PA cath was placed.Swan type:thermodilution PA Cath depth:47 Procedure performed without using ultrasound guided technique. Attempts: 1 Patient tolerated the procedure well with no immediate complications.

## 2018-02-26 NOTE — Progress Notes (Signed)
      [address]       [place] [id number]             [phone]     Pre Procedure note for inpatients:   Shane Yates has been scheduled for Procedure(s): CORONARY ARTERY BYPASS GRAFTING (CABG) (N/A) TRANSESOPHAGEAL ECHOCARDIOGRAM (TEE) (N/A) today. The various methods of treatment have been discussed with the patient. After consideration of the risks, benefits and treatment options the patient has consented to the planned procedure.   The patient has been seen and labs reviewed. There are no changes in the patient's condition to prevent proceeding with the planned procedure today.  Recent labs:  Lab Results  Component Value Date   WBC 6.6 02/26/2018   HGB 13.0 02/26/2018   HCT 40.1 02/26/2018   PLT 203 02/26/2018   GLUCOSE 115 (H) 02/25/2018   CHOL 201 (H) 02/20/2018   TRIG 135 02/20/2018   HDL 56 02/20/2018   LDLCALC 118 (H) 02/20/2018   ALT 24 02/25/2018   AST 32 02/25/2018   NA 141 02/25/2018   K 3.6 02/25/2018   CL 103 02/25/2018   CREATININE 0.99 02/25/2018   BUN 13 02/25/2018   CO2 27 02/25/2018   TSH 0.964 02/20/2018   INR 1.13 02/25/2018   HGBA1C 6.0 (H) 02/20/2018    Shane OvensEdward B Norah Fick, MD 02/26/2018 7:12 AM

## 2018-02-26 NOTE — Procedures (Signed)
Extubation Procedure Note  Patient Details:   Name: Shane Yates DOB: 04/28/1946 MRN: [id number]   Airway Documentation:   NIF -25 VC 1.0L SI 500 best of 4.  Cuff leak heard.  No stridor noted after extubation. Pt extubated to 3L [place]. Pt's SPO2 is 98%.  Pt seems comfortable and no increased WOB noted.   Evaluation  O2 sats: stable throughout Complications: No apparent complications Patient did tolerate procedure well. Bilateral Breath Sounds: Diminished   Yes  Berton BonBlanca E Limon Nunez 02/26/2018, 8:58 PM

## 2018-02-27 ENCOUNTER — Inpatient Hospital Stay (HOSPITAL_COMMUNITY): Payer: BLUE CROSS/BLUE SHIELD

## 2018-02-27 DIAGNOSIS — I200 Unstable angina: Secondary | ICD-10-CM

## 2018-02-27 LAB — POCT I-STAT 3, ART BLOOD GAS (G3+)
Acid-base deficit: 2 mmol/L (ref 0.0–2.0)
BICARBONATE: 23.5 mmol/L (ref 20.0–28.0)
O2 Saturation: 98 %
PCO2 ART: 44.5 mmHg (ref 32.0–48.0)
Patient temperature: 99.1
TCO2: 25 mmol/L (ref 22–32)
pH, Arterial: 7.332 — ABNORMAL LOW (ref 7.350–7.450)
pO2, Arterial: 117 mmHg — ABNORMAL HIGH (ref 83.0–108.0)

## 2018-02-27 LAB — GLUCOSE, CAPILLARY
GLUCOSE-CAPILLARY: 109 mg/dL — AB (ref 70–99)
GLUCOSE-CAPILLARY: 111 mg/dL — AB (ref 70–99)
GLUCOSE-CAPILLARY: 129 mg/dL — AB (ref 70–99)
Glucose-Capillary: 113 mg/dL — ABNORMAL HIGH (ref 70–99)
Glucose-Capillary: 105 mg/dL — ABNORMAL HIGH (ref 70–99)
Glucose-Capillary: 100 mg/dL — ABNORMAL HIGH (ref 70–99)
Glucose-Capillary: 102 mg/dL — ABNORMAL HIGH (ref 70–99)
Glucose-Capillary: 107 mg/dL — ABNORMAL HIGH (ref 70–99)
Glucose-Capillary: 112 mg/dL — ABNORMAL HIGH (ref 70–99)
Glucose-Capillary: 109 mg/dL — ABNORMAL HIGH (ref 70–99)
Glucose-Capillary: 122 mg/dL — ABNORMAL HIGH (ref 70–99)
Glucose-Capillary: 115 mg/dL — ABNORMAL HIGH (ref 70–99)

## 2018-02-27 LAB — CBC
HCT: 32.3 % — ABNORMAL LOW (ref 39.0–52.0)
HEMATOCRIT: 33.9 % — AB (ref 39.0–52.0)
HEMOGLOBIN: 11 g/dL — AB (ref 13.0–17.0)
Hemoglobin: 10.4 g/dL — ABNORMAL LOW (ref 13.0–17.0)
MCH: 30.4 pg (ref 26.0–34.0)
MCH: 30.3 pg (ref 26.0–34.0)
MCHC: 32.4 g/dL (ref 30.0–36.0)
MCHC: 32.2 g/dL (ref 30.0–36.0)
MCV: 93.6 fL (ref 78.0–100.0)
MCV: 94.2 fL (ref 78.0–100.0)
Platelets: 152 10*3/uL (ref 150–400)
Platelets: 130 10*3/uL — ABNORMAL LOW (ref 150–400)
RBC: 3.62 MIL/uL — ABNORMAL LOW (ref 4.22–5.81)
RBC: 3.43 MIL/uL — AB (ref 4.22–5.81)
RDW: 12.7 % (ref 11.5–15.5)
RDW: 13 % (ref 11.5–15.5)
WBC: 14.5 10*3/uL — ABNORMAL HIGH (ref 4.0–10.5)
WBC: 10.8 10*3/uL — AB (ref 4.0–10.5)

## 2018-02-27 LAB — BASIC METABOLIC PANEL
Anion gap: 6 (ref 5–15)
BUN: 10 mg/dL (ref 8–23)
CHLORIDE: 109 mmol/L (ref 98–111)
CO2: 24 mmol/L (ref 22–32)
CREATININE: 1.04 mg/dL (ref 0.61–1.24)
Calcium: 8.1 mg/dL — ABNORMAL LOW (ref 8.9–10.3)
GFR calc non Af Amer: >60 mL/min (ref >60)
Glucose, Bld: 111 mg/dL — ABNORMAL HIGH (ref 70–99)
POTASSIUM: 4.1 mmol/L (ref 3.5–5.1)
Sodium: 139 mmol/L (ref 135–145)

## 2018-02-27 LAB — POCT I-STAT, CHEM 8
BUN: 16 mg/dL (ref 8–23)
CREATININE: 1.4 mg/dL — AB (ref 0.61–1.24)
Calcium, Ion: 1.17 mmol/L (ref 1.15–1.40)
Chloride: 104 mmol/L (ref 98–111)
Glucose, Bld: 127 mg/dL — ABNORMAL HIGH (ref 70–99)
HEMATOCRIT: 29 % — AB (ref 39.0–52.0)
HEMOGLOBIN: 9.9 g/dL — AB (ref 13.0–17.0)
POTASSIUM: 4.2 mmol/L (ref 3.5–5.1)
SODIUM: 138 mmol/L (ref 135–145)
TCO2: 22 mmol/L (ref 22–32)

## 2018-02-27 LAB — CREATININE, SERUM
CREATININE: 1.47 mg/dL — AB (ref 0.61–1.24)
GFR, EST AFRICAN AMERICAN: 54 mL/min — AB (ref >60)
GFR, EST NON AFRICAN AMERICAN: 46 mL/min — AB (ref >60)

## 2018-02-27 LAB — MAGNESIUM
MAGNESIUM: 2.7 mg/dL — AB (ref 1.7–2.4)
Magnesium: 2.8 mg/dL — ABNORMAL HIGH (ref 1.7–2.4)

## 2018-02-27 LAB — COOXEMETRY PANEL
Carboxyhemoglobin: 1.1 % (ref 0.5–1.5)
Methemoglobin: 1.3 % (ref 0.0–1.5)
O2 Saturation: 69.9 %
Total hemoglobin: 11 g/dL — ABNORMAL LOW (ref 12.0–16.0)

## 2018-02-27 MED ORDER — SODIUM CHLORIDE 0.9% FLUSH: 10.0000 mL | INTRAVENOUS | Status: DC | PRN

## 2018-02-27 MED ORDER — CHLORHEXIDINE GLUCONATE CLOTH 2 % EX PADS
6.0000 | MEDICATED_PAD | Freq: Every day | CUTANEOUS | Status: DC
  Administered 2018-02-27 – 2018-03-02 (×4): 6 via TOPICAL

## 2018-02-27 MED ORDER — SODIUM CHLORIDE 0.9% FLUSH
10.0000 mL | Freq: Two times a day (BID) | INTRAVENOUS | Status: DC
  Administered 2018-02-27 – 2018-02-28 (×3): 10 mL

## 2018-02-27 MED ORDER — INSULIN ASPART 100 UNIT/ML ~~LOC~~ SOLN
3.0000 [IU] | Freq: Three times a day (TID) | SUBCUTANEOUS | Status: DC
  Administered 2018-02-28 – 2018-03-02 (×8): 3 [IU] via SUBCUTANEOUS

## 2018-02-27 MED ORDER — FUROSEMIDE 10 MG/ML IJ SOLN
20.0000 mg | Freq: Two times a day (BID) | INTRAMUSCULAR | Status: DC
  Administered 2018-02-27 – 2018-02-28 (×3): 20 mg via INTRAVENOUS
  Filled 2018-02-27 (×2): qty 2

## 2018-02-27 MED ORDER — AMIODARONE HCL 200 MG PO TABS
200.0000 mg | ORAL_TABLET | Freq: Two times a day (BID) | ORAL | Status: DC
  Administered 2018-02-27 – 2018-02-28 (×4): 200 mg via ORAL
  Filled 2018-02-27 (×4): qty 1

## 2018-02-27 MED ORDER — INSULIN DETEMIR 100 UNIT/ML ~~LOC~~ SOLN
12.0000 [IU] | Freq: Once | SUBCUTANEOUS | Status: AC
  Administered 2018-02-27: 12 [IU] via SUBCUTANEOUS
  Filled 2018-02-27: qty 0.12

## 2018-02-27 MED ORDER — INSULIN ASPART 100 UNIT/ML ~~LOC~~ SOLN
0.0000 [IU] | SUBCUTANEOUS | Status: DC
  Administered 2018-02-28 (×2): 2 [IU] via SUBCUTANEOUS

## 2018-02-27 NOTE — Progress Notes (Signed)
CT surgery p.m. Rounds  Incisional pain has improved the course of the day In sinus rhythm Pressors weaned off still on low-dose milrinone Diuresing with Lasix P.m. labs are satisfactory

## 2018-02-27 NOTE — Progress Notes (Signed)
1 Day Post-Op Procedure(s) (LRB): CORONARY ARTERY BYPASS GRAFTING (CABG), ON PUMP, TIMES FOUR, USING LEFT INTERNAL MAMMARY ARTERY AND ENDOSCOPICALLY HARVESTED BILATERAL GREATER SAPHENOUS VEINS (N/A) TRANSESOPHAGEAL ECHOCARDIOGRAM (TEE) (N/A) Subjective: Extubated last pm C/o incisional pain  Objective: Vital signs in last 24 hours: Temp:  [96.6 F (35.9 C)-99.9 F (37.7 C)] 98.8 F (37.1 C) (08/10 0900) Pulse Rate:  [72-94] 72 (08/10 0900) Cardiac Rhythm: Normal sinus rhythm (08/10 0800) Resp:  [11-36] 14 (08/10 0900) BP: (88-123)/(46-72) 104/52 (08/10 0900) SpO2:  [93 %-100 %] 98 % (08/10 0900) Arterial Line BP: (95-137)/(17-57) 137/47 (08/10 0900) FiO2 (%):  [40 %-50 %] 40 % (08/09 1900) Weight:  [88.9 kg] 88.9 kg (08/10 0500)  Hemodynamic parameters for last 24 hours: PAP: (15-28)/(8-17) 25/11 CO:  [4.3 L/min-4.6 L/min] 4.3 L/min CI:  [2.1 L/min/m2-2.3 L/min/m2] 2.1 L/min/m2  Intake/Output from previous day: 08/09 0701 - 08/10 0700 In: 6364.5 [I.V.:3721.4; Blood:775; IV Piggyback:1868] Out: 2837 [ZOXWR:6045[Urine:1687; Blood:900; Chest Tube:250] Intake/Output this shift: Total I/O In: 265.5 [I.V.:165.5; IV Piggyback:100] Out: 110 [Urine:70; Chest Tube:40]  nsr  no air leak  Lab Results: Recent Labs    02/26/18 2037 02/26/18 2043 02/27/18 0437  WBC 17.5*  --  14.5*  HGB 12.3* 12.9* 11.0*  HCT 38.2* 38.0* 33.9*  PLT 163  --  152   BMET:  Recent Labs    02/26/18 0614  02/26/18 2043 02/27/18 0437  NA 139   < > 139 139  K 3.9   < > 3.9 4.1  CL 102   < > 106 109  CO2 22  --   --  24  GLUCOSE 129*   < > 152* 111*  BUN 14   < > 11 10  CREATININE 1.02   < > 0.90 1.04  CALCIUM 9.4  --   --  8.1*   < > = values in this interval not displayed.    PT/INR:  Recent Labs    02/26/18 1426  LABPROT 17.5*  INR 1.45   ABG    Component Value Date/Time   PHART 7.332 (L) 02/27/2018 0434   HCO3 23.5 02/27/2018 0434   TCO2 25 02/27/2018 0434   ACIDBASEDEF 2.0 02/27/2018  0434   O2SAT 69.9 02/27/2018 0500   CBG (last 3)  Recent Labs    02/27/18 0433 02/27/18 0756 02/27/18 0909  GLUCAP 100* 107* 112*    Assessment/Plan: S/P Procedure(s) (LRB): CORONARY ARTERY BYPASS GRAFTING (CABG), ON PUMP, TIMES FOUR, USING LEFT INTERNAL MAMMARY ARTERY AND ENDOSCOPICALLY HARVESTED BILATERAL GREATER SAPHENOUS VEINS (N/A) TRANSESOPHAGEAL ECHOCARDIOGRAM (TEE) (N/A) Mobilize Diuresis Diabetes control d/c tubes/lines See progression orders   LOS: 7 days    Kathlee NationsPeter Van Trigt III 02/27/2018

## 2018-02-27 NOTE — Plan of Care (Signed)
  Problem: Activity: Goal: Risk for activity intolerance will decrease Outcome: Progressing   Problem: Cardiac: Goal: Will achieve and/or maintain hemodynamic stability Outcome: Progressing   Problem: Clinical Measurements: Goal: Postoperative complications will be avoided or minimized Outcome: Progressing   Problem: Respiratory: Goal: Respiratory status will improve Outcome: Progressing   Problem: Skin Integrity: Goal: Wound healing without signs and symptoms of infection Outcome: Progressing Goal: Risk for impaired skin integrity will decrease Outcome: Progressing   Problem: Urinary Elimination: Goal: Ability to achieve and maintain adequate renal perfusion and functioning will improve Outcome: Progressing   

## 2018-02-28 ENCOUNTER — Inpatient Hospital Stay (HOSPITAL_COMMUNITY): Payer: BLUE CROSS/BLUE SHIELD

## 2018-02-28 LAB — GLUCOSE, CAPILLARY
GLUCOSE-CAPILLARY: 119 mg/dL — AB (ref 70–99)
GLUCOSE-CAPILLARY: 122 mg/dL — AB (ref 70–99)
GLUCOSE-CAPILLARY: 180 mg/dL — AB (ref 70–99)
Glucose-Capillary: 96 mg/dL (ref 70–99)
Glucose-Capillary: 130 mg/dL — ABNORMAL HIGH (ref 70–99)
Glucose-Capillary: 117 mg/dL — ABNORMAL HIGH (ref 70–99)
Glucose-Capillary: 142 mg/dL — ABNORMAL HIGH (ref 70–99)

## 2018-02-28 LAB — BASIC METABOLIC PANEL
Anion gap: 8 (ref 5–15)
BUN: 19 mg/dL (ref 8–23)
CO2: 23 mmol/L (ref 22–32)
Calcium: 8.1 mg/dL — ABNORMAL LOW (ref 8.9–10.3)
Chloride: 106 mmol/L (ref 98–111)
Creatinine, Ser: 1.53 mg/dL — ABNORMAL HIGH (ref 0.61–1.24)
GFR calc Af Amer: 51 mL/min — ABNORMAL LOW (ref >60)
GFR calc non Af Amer: 44 mL/min — ABNORMAL LOW (ref >60)
Glucose, Bld: 128 mg/dL — ABNORMAL HIGH (ref 70–99)
Potassium: 4 mmol/L (ref 3.5–5.1)
Sodium: 137 mmol/L (ref 135–145)

## 2018-02-28 LAB — CBC
HCT: 31.3 % — ABNORMAL LOW (ref 39.0–52.0)
Hemoglobin: 9.9 g/dL — ABNORMAL LOW (ref 13.0–17.0)
MCH: 30.4 pg (ref 26.0–34.0)
MCHC: 31.6 g/dL (ref 30.0–36.0)
MCV: 96 fL (ref 78.0–100.0)
Platelets: 115 10*3/uL — ABNORMAL LOW (ref 150–400)
RBC: 3.26 MIL/uL — ABNORMAL LOW (ref 4.22–5.81)
RDW: 13.1 % (ref 11.5–15.5)
WBC: 9.2 10*3/uL (ref 4.0–10.5)

## 2018-02-28 MED ORDER — FUROSEMIDE 40 MG PO TABS: 40.0000 mg | ORAL_TABLET | Freq: Every day | ORAL | Status: DC

## 2018-02-28 MED ORDER — SODIUM CHLORIDE 0.9 % IV SOLN: 250.0000 mL | INTRAVENOUS | Status: DC | PRN

## 2018-02-28 MED ORDER — INSULIN ASPART 100 UNIT/ML ~~LOC~~ SOLN: 0.0000 [IU] | Freq: Every day | SUBCUTANEOUS | Status: DC

## 2018-02-28 MED ORDER — INSULIN ASPART 100 UNIT/ML ~~LOC~~ SOLN
0.0000 [IU] | Freq: Three times a day (TID) | SUBCUTANEOUS | Status: DC
  Administered 2018-02-28: 2 [IU] via SUBCUTANEOUS
  Administered 2018-02-28: 3 [IU] via SUBCUTANEOUS
  Administered 2018-03-01: 2 [IU] via SUBCUTANEOUS
  Administered 2018-03-01 – 2018-03-02 (×3): 3 [IU] via SUBCUTANEOUS

## 2018-02-28 MED ORDER — SODIUM CHLORIDE 0.9% FLUSH
3.0000 mL | Freq: Two times a day (BID) | INTRAVENOUS | Status: DC
  Administered 2018-02-28 – 2018-03-02 (×4): 3 mL via INTRAVENOUS

## 2018-02-28 MED ORDER — MAGNESIUM HYDROXIDE 400 MG/5ML PO SUSP
30.0000 mL | Freq: Every day | ORAL | Status: DC | PRN
  Administered 2018-02-28: 30 mL via ORAL
  Filled 2018-02-28: qty 30

## 2018-02-28 MED ORDER — FUROSEMIDE 10 MG/ML IJ SOLN: 20.0000 mg | Freq: Every day | INTRAMUSCULAR | Status: DC

## 2018-02-28 MED ORDER — SODIUM CHLORIDE 0.9% FLUSH: 3.0000 mL | INTRAVENOUS | Status: DC | PRN

## 2018-02-28 MED ORDER — ENOXAPARIN SODIUM 40 MG/0.4ML ~~LOC~~ SOLN
40.0000 mg | SUBCUTANEOUS | Status: DC
  Administered 2018-02-28 – 2018-03-02 (×3): 40 mg via SUBCUTANEOUS
  Filled 2018-02-28 (×3): qty 0.4

## 2018-02-28 MED ORDER — MOVING RIGHT ALONG BOOK
Freq: Once | Status: AC
  Administered 2018-02-28: 10:00:00
  Filled 2018-02-28: qty 1

## 2018-02-28 NOTE — Progress Notes (Signed)
2 Days Post-Op Procedure(s) (LRB): CORONARY ARTERY BYPASS GRAFTING (CABG), ON PUMP, TIMES FOUR, USING LEFT INTERNAL MAMMARY ARTERY AND ENDOSCOPICALLY HARVESTED BILATERAL GREATER SAPHENOUS VEINS (N/A) TRANSESOPHAGEAL ECHOCARDIOGRAM (TEE) (N/A) Subjective: Room air nsr Walked 400 ft Labs ok- creat 1,5 tx to stepdown  Objective: Vital signs in last 24 hours: Temp:  [97.5 F (36.4 C)-99.1 F (37.3 C)] 97.5 F (36.4 C) (08/11 0738) Pulse Rate:  [64-84] 80 (08/11 0800) Cardiac Rhythm: Normal sinus rhythm (08/10 2000) Resp:  [11-28] 22 (08/11 0800) BP: (95-121)/(52-65) 109/57 (08/11 0700) SpO2:  [91 %-100 %] 94 % (08/11 0800) Arterial Line BP: (101-166)/(43-64) 101/45 (08/11 0500) Weight:  [83.8 kg] 83.8 kg (08/11 0600)  Hemodynamic parameters for last 24 hours: PAP: (21-22)/(11) 21/11  Intake/Output from previous day: 08/10 0701 - 08/11 0700 In: 1336.1 [P.O.:180; I.V.:956.1; IV Piggyback:200] Out: 965 [Urine:865; Chest Tube:100] Intake/Output this shift: Total I/O In: 3.1 [I.V.:3.1] Out: -        Exam    General- alert and comfortable    Neck- no JVD, no cervical adenopathy palpable, no carotid bruit   Lungs- clear without rales, wheezes   Cor- regular rate and rhythm, no murmur , gallop   Abdomen- soft, non-tender   Extremities - warm, non-tender, minimal edema   Neuro- oriented, appropriate, no focal weakness   Lab Results: Recent Labs    02/27/18 1652 02/27/18 1658 02/28/18 0411  WBC 10.8*  --  9.2  HGB 10.4* 9.9* 9.9*  HCT 32.3* 29.0* 31.3*  PLT 130*  --  115*   BMET:  Recent Labs    02/27/18 0437  02/27/18 1658 02/28/18 0411  NA 139  --  138 137  K 4.1  --  4.2 4.0  CL 109  --  104 106  CO2 24  --   --  23  GLUCOSE 111*  --  127* 128*  BUN 10  --  16 19  CREATININE 1.04   < > 1.40* 1.53*  CALCIUM 8.1*  --   --  8.1*   < > = values in this interval not displayed.    PT/INR:  Recent Labs    02/26/18 1426  LABPROT 17.5*  INR 1.45   ABG     Component Value Date/Time   PHART 7.332 (L) 02/27/2018 0434   HCO3 23.5 02/27/2018 0434   TCO2 22 02/27/2018 1658   ACIDBASEDEF 2.0 02/27/2018 0434   O2SAT 69.9 02/27/2018 0500   CBG (last 3)  Recent Labs    02/27/18 2348 02/28/18 0403 02/28/18 0740  GLUCAP 122* 130* 117*    Assessment/Plan: S/P Procedure(s) (LRB): CORONARY ARTERY BYPASS GRAFTING (CABG), ON PUMP, TIMES FOUR, USING LEFT INTERNAL MAMMARY ARTERY AND ENDOSCOPICALLY HARVESTED BILATERAL GREATER SAPHENOUS VEINS (N/A) TRANSESOPHAGEAL ECHOCARDIOGRAM (TEE) (N/A) Mobilize Diuresis Diabetes control d/c tubes/lines Plan for transfer to step-down: see transfer orders   LOS: 8 days    Kathlee Nationseter Van Trigt III 02/28/2018

## 2018-02-28 NOTE — Plan of Care (Signed)
  Problem: Education: Goal: Knowledge of General Education information will improve Description Including pain rating scale, medication(s)/side effects and non-pharmacologic comfort measures Outcome: Progressing   Problem: Clinical Measurements: Goal: Ability to maintain clinical measurements within normal limits will improve Outcome: Progressing Goal: Will remain free from infection Outcome: Progressing Goal: Diagnostic test results will improve Outcome: Progressing Goal: Respiratory complications will improve Outcome: Progressing Goal: Cardiovascular complication will be avoided Outcome: Progressing   Problem: Coping: Goal: Level of anxiety will decrease Outcome: Progressing   Problem: Elimination: Goal: Will not experience complications related to urinary retention Outcome: Progressing   Problem: Activity: Goal: Risk for activity intolerance will decrease Outcome: Progressing   Problem: Cardiac: Goal: Will achieve and/or maintain hemodynamic stability Outcome: Progressing   Problem: Clinical Measurements: Goal: Postoperative complications will be avoided or minimized Outcome: Progressing   Problem: Respiratory: Goal: Respiratory status will improve Outcome: Progressing   Problem: Skin Integrity: Goal: Wound healing without signs and symptoms of infection Outcome: Progressing Goal: Risk for impaired skin integrity will decrease Outcome: Progressing   Problem: Urinary Elimination: Goal: Ability to achieve and maintain adequate renal perfusion and functioning will improve Outcome: Progressing

## 2018-02-28 NOTE — Evaluation (Signed)
Physical Therapy Evaluation Patient Details Name: Shane Yates MRN: [id number] DOB: 11/01/1945 Today's Date: 02/28/2018   History of Present Illness  72 y.o. 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 PMH includes: HTN, HLD, EtOH dependence, DM, CAD.     Clinical Impression  Pt admitted with above diagnosis. Pt currently with functional limitations due to the deficits listed below (see PT Problem List).  Limited history due to language barrier, son to interpret however stepped out by time of PT eval. Today pt ambulating unit with eva walker, DOE 2/4, WNL SpO2 on RA, HRmax 88. Feel patient will progress well enough to not require additional physical therapy, whoever would recommend cardiac OP rehab.  Pt will benefit from skilled PT to increase their independence and safety with mobility to allow discharge to the venue listed below.       Follow Up Recommendations (Cardiac OP Rehab)    Equipment Recommendations  Other (comment)(TBD)    Recommendations for Other Services       Precautions / Restrictions Precautions Precautions: Fall;Sternal Precaution Comments: per RN interpreter has gone over sternal precautions and with son today Restrictions Weight Bearing Restrictions: Yes      Mobility  Bed Mobility Overal bed mobility: Needs Assistance Bed Mobility: Supine to Sit     Supine to sit: Min assist     General bed mobility comments: min A to power up and adhere to sternal precautions   Transfers Overall transfer level: Needs assistance Equipment used: None Transfers: Sit to/from Stand Sit to Stand: Min guard         General transfer comment: min guard for safety   Ambulation/Gait Ambulation/Gait assistance: Min guard Gait Distance (Feet): 550 Feet Assistive device: (eva walker) Gait Pattern/deviations: Step-through pattern Gait velocity: decreased   General Gait Details: pt ambulating 1 lap of unit, HR max 85,  SpO2 WNL on RA, c/o of post op pain in chest. DOE 2/4  Stairs            Wheelchair Mobility    Modified Rankin (Stroke Patients Only)       Balance Overall balance assessment: Needs assistance   Sitting balance-Leahy Scale: Fair       Standing balance-Leahy Scale: Fair                               Pertinent Vitals/Pain Pain Assessment: Faces Faces Pain Scale: Hurts little more Pain Location: chest Pain Descriptors / Indicators: Discomfort Pain Intervention(s): Limited activity within patient's tolerance;Monitored during session    Home Living Family/patient expects to be discharged to:: Private residence Living Arrangements: Spouse/significant other Available Help at Discharge: Available 24 hours/day Type of Home: House                Prior Function Level of Independence: Independent         Comments: PLOF and home setup limited due to langauge barrier, son stepped out and was not able to interpret.      Hand Dominance        Extremity/Trunk Assessment   Upper Extremity Assessment Upper Extremity Assessment: Overall WFL for tasks assessed    Lower Extremity Assessment Lower Extremity Assessment: Overall WFL for tasks assessed       Communication      Cognition Arousal/Alertness: Awake/alert   Overall Cognitive Status: Within Functional Limits for tasks assessed  General Comments      Exercises     Assessment/Plan    PT Assessment Patient needs continued PT services  PT Problem List Cardiopulmonary status limiting activity;Decreased strength;Decreased activity tolerance       PT Treatment Interventions DME instruction;Gait training;Stair training;Functional mobility training;Therapeutic activities;Therapeutic exercise;Balance training    PT Goals (Current goals can be found in the Care Plan section)  Acute Rehab PT Goals Patient Stated Goal: non stated PT  Goal Formulation: With patient Time For Goal Achievement: 03/07/18 Potential to Achieve Goals: Good    Frequency Min 3X/week   Barriers to discharge        Co-evaluation               AM-PAC PT "6 Clicks" Daily Activity  Outcome Measure Difficulty turning over in bed (including adjusting bedclothes, sheets and blankets)?: Unable Difficulty moving from lying on back to sitting on the side of the bed? : Unable Difficulty sitting down on and standing up from a chair with arms (e.g., wheelchair, bedside commode, etc,.)?: Unable Help needed moving to and from a bed to chair (including a wheelchair)?: A Little Help needed walking in hospital room?: A Little Help needed climbing 3-5 steps with a railing? : A Lot 6 Click Score: 11    End of Session Equipment Utilized During Treatment: Gait belt Activity Tolerance: Patient tolerated treatment well Patient left: in chair;with call bell/phone within reach;with nursing/sitter in room;with family/visitor present Nurse Communication: Mobility status PT Visit Diagnosis: Unsteadiness on feet (R26.81)    Time: 1720-1745 PT Time Calculation (min) (ACUTE ONLY): 25 min   Charges:   PT Evaluation $PT Eval Low Complexity: 1 Low PT Treatments $Gait Training: 8-22 mins        Etta Grandchild, PT, DPT Acute Rehab Services Pager: [phone]    Etta Grandchild 02/28/2018, 5:43 PM

## 2018-02-28 NOTE — Progress Notes (Signed)
Received Mr. Shane Yates from [facility] via W/C, ambulated to bedside chair.  Patient is awake, alert and oriented x 4.  Speaks a little english, but son [name] interprets for the patient.  Wife is at the bedside.  [facility] and is wearing bilateral hearing aids. Gauze dressings to right neck and  bilateral wrists clean dry and intact.  Surgical dressing to central chest and gauze dressing to the upper abdomen clean dry and intact.  Pacer wires intact and tapped to upper abdomen.  Angiocath IV to right and left AC sites, clamped and saline locked.  Multiple skin glue incision sites to the medial right leg, intact, small swelling and oozing noted to medial knee site.  Left medial leg incision skin glue intact and dry.  VSS, BBS clear thru out.  Connected to cardiac monitor and contacted CCMD.  Orientation to room and department. Verbalized understanding.

## 2018-03-01 ENCOUNTER — Encounter (HOSPITAL_COMMUNITY): Payer: Self-pay | Admitting: Cardiothoracic Surgery

## 2018-03-01 ENCOUNTER — Inpatient Hospital Stay (HOSPITAL_COMMUNITY): Payer: BLUE CROSS/BLUE SHIELD

## 2018-03-01 DIAGNOSIS — Z951 Presence of aortocoronary bypass graft: Secondary | ICD-10-CM

## 2018-03-01 LAB — CBC
HCT: 32.4 % — ABNORMAL LOW (ref 39.0–52.0)
Hemoglobin: 10.4 g/dL — ABNORMAL LOW (ref 13.0–17.0)
MCH: 30.3 pg (ref 26.0–34.0)
MCHC: 32.1 g/dL (ref 30.0–36.0)
MCV: 94.5 fL (ref 78.0–100.0)
Platelets: 144 10*3/uL — ABNORMAL LOW (ref 150–400)
RBC: 3.43 MIL/uL — ABNORMAL LOW (ref 4.22–5.81)
RDW: 13.1 % (ref 11.5–15.5)
WBC: 8.9 10*3/uL (ref 4.0–10.5)

## 2018-03-01 LAB — GLUCOSE, CAPILLARY
GLUCOSE-CAPILLARY: 114 mg/dL — AB (ref 70–99)
Glucose-Capillary: 144 mg/dL — ABNORMAL HIGH (ref 70–99)
Glucose-Capillary: 122 mg/dL — ABNORMAL HIGH (ref 70–99)
Glucose-Capillary: 155 mg/dL — ABNORMAL HIGH (ref 70–99)

## 2018-03-01 LAB — BASIC METABOLIC PANEL
Anion gap: 10 (ref 5–15)
BUN: 27 mg/dL — ABNORMAL HIGH (ref 8–23)
CO2: 24 mmol/L (ref 22–32)
Calcium: 8.1 mg/dL — ABNORMAL LOW (ref 8.9–10.3)
Chloride: 104 mmol/L (ref 98–111)
Creatinine, Ser: 1.59 mg/dL — ABNORMAL HIGH (ref 0.61–1.24)
GFR calc Af Amer: 49 mL/min — ABNORMAL LOW (ref >60)
GFR calc non Af Amer: 42 mL/min — ABNORMAL LOW (ref >60)
Glucose, Bld: 130 mg/dL — ABNORMAL HIGH (ref 70–99)
Potassium: 3.9 mmol/L (ref 3.5–5.1)
Sodium: 138 mmol/L (ref 135–145)

## 2018-03-01 MED ORDER — NAPHAZOLINE-GLYCERIN 0.012-0.2 % OP SOLN
1.0000 [drp] | Freq: Four times a day (QID) | OPHTHALMIC | Status: DC | PRN
  Administered 2018-03-01: 2 [drp] via OPHTHALMIC
  Filled 2018-03-01: qty 15

## 2018-03-01 MED ORDER — FUROSEMIDE 10 MG/ML IJ SOLN
40.0000 mg | Freq: Once | INTRAMUSCULAR | Status: AC
  Administered 2018-03-01: 40 mg via INTRAVENOUS
  Filled 2018-03-01: qty 4

## 2018-03-01 MED ORDER — FUROSEMIDE 40 MG PO TABS: 40.0000 mg | ORAL_TABLET | Freq: Every day | ORAL | Status: DC

## 2018-03-01 MED ORDER — AMIODARONE HCL 200 MG PO TABS
200.0000 mg | ORAL_TABLET | Freq: Every day | ORAL | Status: DC
  Administered 2018-03-01 – 2018-03-03 (×3): 200 mg via ORAL
  Filled 2018-03-01 (×3): qty 1

## 2018-03-01 MED ORDER — LACTULOSE 10 GM/15ML PO SOLN
20.0000 g | Freq: Once | ORAL | Status: AC
  Administered 2018-03-01: 20 g via ORAL
  Filled 2018-03-01: qty 30

## 2018-03-01 MED ORDER — POTASSIUM CHLORIDE CRYS ER 20 MEQ PO TBCR
20.0000 meq | EXTENDED_RELEASE_TABLET | Freq: Once | ORAL | Status: AC
  Administered 2018-03-01: 20 meq via ORAL
  Filled 2018-03-01: qty 1

## 2018-03-01 NOTE — Progress Notes (Addendum)
      [address]       [place] [id number]             [phone]        3 Days Post-Op Procedure(s) (LRB): CORONARY ARTERY BYPASS GRAFTING (CABG), ON PUMP, TIMES FOUR, USING LEFT INTERNAL MAMMARY ARTERY AND ENDOSCOPICALLY HARVESTED BILATERAL GREATER SAPHENOUS VEINS (N/A) TRANSESOPHAGEAL ECHOCARDIOGRAM (TEE) (N/A)  Subjective: Patient states is having breathing problems this am. A nurse assisted in translation as patient speaks Hindu. Son later arrived and confirmed the preceding. Patient has not had bowel movement yet and has "itchy" eyes  Objective: Vital signs in last 24 hours: Temp:  [97.5 F (36.4 C)-98.5 F (36.9 C)] 98 F (36.7 C) (08/12 0439) Pulse Rate:  [64-80] 65 (08/12 0439) Cardiac Rhythm: Normal sinus rhythm (08/12 0418) Resp:  [12-29] 12 (08/12 0439) BP: (103-130)/(55-70) 127/70 (08/12 0439) SpO2:  [91 %-96 %] 93 % (08/12 0439) Weight:  [86.4 kg] 86.4 kg (08/12 0500)  Pre op weight 81.5 kg Current Weight  03/01/18 86.4 kg      Intake/Output from previous day: 08/11 0701 - 08/12 0700 In: 17.1 [I.V.:10; IV Piggyback:7.1] Out: 790 [Urine:790]   Physical Exam:  Cardiovascular: RRR Pulmonary: Diminished at bases. Abdomen: Soft, non tender, bowel sounds present. Extremities: Mild bilateral lower extremity edema. Wounds: LE wounds are clean and dry.  No erythema or signs of infection. Aquacel intact.  Lab Results: CBC: Recent Labs    02/28/18 0411 03/01/18 0252  WBC 9.2 8.9  HGB 9.9* 10.4*  HCT 31.3* 32.4*  PLT 115* 144*   BMET:  Recent Labs    02/28/18 0411 03/01/18 0252  NA 137 138  K 4.0 3.9  CL 106 104  CO2 23 24  GLUCOSE 128* 130*  BUN 19 27*  CREATININE 1.53* 1.59*  CALCIUM 8.1* 8.1*    PT/INR:  Lab Results  Component Value Date   INR 1.45 02/26/2018   INR 1.13 02/25/2018   ABG:  INR: Will add last result for INR, ABG once components are confirmed Will add last 4 CBG results once components are  confirmed  Assessment/Plan:  1. CV - SR in the 60's. On Amiodarone 200 mg bid, Lopressor 12.5 mg bid. Will decrease Amiodarone to 200 mg daily. 2.  Pulmonary - On room air. CXR ordered but not taken yet. Encourage incentive spirometer. 3. Volume Overload - On Lasix 40 mg orally daily. As discussed with Dr. Tyrone SageGerhardt, will give Lasix 40 mg IV once today. 4.  Acute blood loss anemia - H and H stable at 10.4 and 32.4 5. Creatinine slightly increased to 1.59 this am.  6. Supplement potassium 7. Mild thrombocytopenia-platelets up to 144,000 8. DM-CBGs 180/119/114. On Insulin. Pre op HGA1C 6. As discussed with son, will transition patient to Metformin (on Diap[place] from [place] pre op) and then Glimepiride once creatinine more normal. 9. Remove EPW 10. LOC constipation, Visine drops PRN eyes  Donielle M ZimmermanPA-C 03/01/2018,7:05 AM [phone]  Cr not changed much since yesteday, but up 0.6 from preop- Mild acute kidney injury - stage 1  I have seen and examined Lollie Sailsehal Ciani and agree with the above assessment  and plan.  Delight OvensEdward B Hoang Reich MD Beeper [phone] Office [phone] 03/01/2018 1:25 PM

## 2018-03-01 NOTE — Progress Notes (Signed)
Pacing wires removed per order. Patient instructed on bed rest and vital signs monitoring initiated. 

## 2018-03-01 NOTE — Discharge Instructions (Signed)
Coronary Artery Bypass Grafting, Care After °This sheet gives you information about how to care for yourself after your procedure. Your health care provider may also give you more specific instructions. If you have problems or questions, contact your health care provider. °What can I expect after the procedure? °After the procedure, it is common to have: °· Nausea and a lack of appetite. °· Constipation. °· Weakness and fatigue. °· Depression or irritability. °· Pain or discomfort in your incision areas. ° °Follow these instructions at home: °Medicines °· Take over-the-counter and prescription medicines only as told by your health care provider. Do not stop taking medicines or start any new medicines without approval from your health care provider. °· If you were prescribed an antibiotic medicine, take it as told by your health care provider. Do not stop taking the antibiotic even if you start to feel better. °· Do not drive or use heavy machinery while taking prescription pain medicine. °Incision care °· Follow instructions from your health care provider about how to take care of your incisions. Make sure you: °? Wash your hands with soap and water before you change your bandage (dressing). If soap and water are not available, use hand sanitizer. °? Change your dressing as told by your health care provider. °? Leave stitches (sutures), skin glue, or adhesive strips in place. These skin closures may need to stay in place for 2 weeks or longer. If adhesive strip edges start to loosen and curl up, you may trim the loose edges. Do not remove adhesive strips completely unless your health care provider tells you to do that. °· Keep incision areas clean, dry, and protected. °· Check your incision areas every day for signs of infection. Check for: °? More redness, swelling, or pain. °? More fluid or blood. °? Warmth. °? Pus or a bad smell. °· If incisions were made in your legs: °? Avoid crossing your legs. °? Avoid  sitting for long periods of time. Change positions every 30 minutes. °? Raise (elevate) your legs when you are sitting. °Bathing °· Do not take baths, swim, or use a hot tub until your health care provider approves. °· Only take sponge baths. Pat the incisions dry. Do not rub incisions with a washcloth or towel. °· Ask your health care provider when you can shower. °Eating and drinking °· Eat foods that are high in fiber, such as raw fruits and vegetables, whole grains, beans, and nuts. Meats should be lean cut. Avoid canned, processed, and fried foods. This can help prevent constipation and is a recommended part of a heart-healthy diet. °· Drink enough fluid to keep your urine clear or pale yellow. °· Limit alcohol intake to no more than 1 drink a day for nonpregnant women and 2 drinks a day for men. One drink equals 12 oz of beer, 5 oz of wine, or 1½ oz of hard liquor. °Activity °· Rest and limit your activity as told by your health care provider. You may be instructed to: °? Stop any activity right away if you have chest pain, shortness of breath, irregular heartbeats, or dizziness. Get help right away if you have any of these symptoms. °? Move around frequently for short periods or take short walks as directed by your health care provider. Gradually increase your activities. You may need physical therapy or cardiac rehabilitation to help strengthen your muscles and build your endurance. °? Avoid lifting, pushing, or pulling anything that is heavier than 10 lb (4.5 kg) for at   least 6 weeks or as told by your health care provider. °· Do not drive until your health care provider approves. °· Ask your health care provider when you may return to work. °· Ask your health care provider when you may resume sexual activity. °General instructions °· Do not use any products that contain nicotine or tobacco, such as cigarettes and e-cigarettes. If you need help quitting, ask your health care provider. °· Take 2-3 deep  breaths every few hours during the day, while you recover. This helps expand your lungs and prevent complications like pneumonia after surgery. °· If you were given a device called an incentive spirometer, use it several times a day to practice deep breathing. Support your chest with a pillow or your arms when you take deep breaths or cough. °· Wear compression stockings as told by your health care provider. These stockings help to prevent blood clots and reduce swelling in your legs. °· Weigh yourself every day. This helps identify if your body is holding (retaining) fluid that may make your heart and lungs work harder. °· Keep all follow-up visits as told by your health care provider. This is important. °Contact a health care provider if: °· You have more redness, swelling, or pain around any incision. °· You have more fluid or blood coming from any incision. °· Any incision feels warm to the touch. °· You have pus or a bad smell coming from any incision °· You have a fever. °· You have swelling in your ankles or legs. °· You have pain in your legs. °· You gain 2 lb (0.9 kg) or more a day. °· You are nauseous or you vomit. °· You have diarrhea. °Get help right away if: °· You have chest pain that spreads to your jaw or arms. °· You are short of breath. °· You have a fast or irregular heartbeat. °· You notice a "clicking" in your breastbone (sternum) when you move. °· You have numbness or weakness in your arms or legs. °· You feel dizzy or light-headed. °Summary °· After the procedure, it is common to have pain or discomfort in the incision areas. °· Do not take baths, swim, or use a hot tub until your health care provider approves. °· Gradually increase your activities. You may need physical therapy or cardiac rehabilitation to help strengthen your muscles and build your endurance. °· Weigh yourself every day. This helps identify if your body is holding (retaining) fluid that may make your heart and lungs work  harder. °This information is not intended to replace advice given to you by your health care provider. Make sure you discuss any questions you have with your health care provider. °Document Released: 01/24/2005 Document Revised: 05/26/2016 Document Reviewed: 05/26/2016 °Elsevier Interactive Patient Education © 2018 [employer] ° ° °Endoscopic Saphenous Vein Harvesting, Care After °Refer to this sheet in the next few weeks. These instructions provide you with information about caring for yourself after your procedure. Your health care provider may also give you more specific instructions. Your treatment has been planned according to current medical practices, but problems sometimes occur. Call your health care provider if you have any problems or questions after your procedure. °What can I expect after the procedure? °After the procedure, it is common to have: °· Pain. °· Bruising. °· Swelling. °· Numbness. ° °Follow these instructions at home: °Medicine °· Take over-the-counter and prescription medicines only as told by your health care provider. °· Do not drive or operate heavy machinery   while taking prescription pain medicine. °Incision care ° °· Follow instructions from your health care provider about how to take care of the cut made during surgery (incision). Make sure you: °? Wash your hands with soap and water before you change your bandage (dressing). If soap and water are not available, use hand sanitizer. °? Change your dressing as told by your health care provider. °? Leave stitches (sutures), skin glue, or adhesive strips in place. These skin closures may need to be in place for 2 weeks or longer. If adhesive strip edges start to loosen and curl up, you may trim the loose edges. Do not remove adhesive strips completely unless your health care provider tells you to do that. °· Check your incision area every day for signs of infection. Check for: °? More redness, swelling, or pain. °? More fluid or  blood. °? Warmth. °? Pus or a bad smell. °General instructions °· Raise (elevate) your legs above the level of your heart while you are sitting or lying down. °· Do any exercises your health care providers have given you. These may include deep breathing, coughing, and walking exercises. °· Do not shower, take baths, swim, or use a hot tub unless told by your health care provider. °· Wear your elastic stocking if told by your health care provider. °· Keep all follow-up visits as told by your health care provider. This is important. °Contact a health care provider if: °· Medicine does not help your pain. °· Your pain gets worse. °· You have new leg bruises or your leg bruises get bigger. °· You have a fever. °· Your leg feels numb. °· You have more redness, swelling, or pain around your incision. °· You have more fluid or blood coming from your incision. °· Your incision feels warm to the touch. °· You have pus or a bad smell coming from your incision. °Get help right away if: °· Your pain is severe. °· You develop pain, tenderness, warmth, redness, or swelling in any part of your leg. °· You have chest pain. °· You have trouble breathing. °This information is not intended to replace advice given to you by your health care provider. Make sure you discuss any questions you have with your health care provider. °Document Released: 03/19/2011 Document Revised: 12/13/2015 Document Reviewed: 05/21/2015 °Elsevier Interactive Patient Education © 2018 [employer] ° ° °

## 2018-03-01 NOTE — Op Note (Signed)
Shane Shane Yates: Shane Yates, Shane Shane Yates MEDICAL RECORD ZO:[id number] ACCOUNT [id number] DATE OF BIRTH:January 04, 1946 FACILITY: [facility] LOCATION: [id number] PHYSICIAN:Mearl Olver Bari EdwardB. 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  SURGEON:  Shane PlaneEdward Zethan Alfieri, MD.  FIRST ASSISTANT:  Shane Fudgeonielle Zimmerman, PA.  BRIEF HISTORY:  The patient is a 72 year old Hindu man who had some known coronary occlusive disease from a cardiac catheterization in [place] several years ago.  The patient now presents with new onset of chest pain.  He was stabilized medically,  underwent cardiac catheterization by Shane Shane Yates, which demonstrated a small nondominant totally occluded right coronary artery, a dominant left system with 80% left main, 70% stenosis in the distal circumflex branch and in the posterior descending  arising from the distal circumflex, a moderate-size intermediate vessel had 80% stenosis.  The LAD had diffuse disease with 70 to 80% stenosis through a major portion of the mid-LAD.  With the patient's significant left main disease, 3-vessel disease,  coronary artery bypass grafting was recommended to the patient, who agreed and signed informed consent.  Overall, ejection fraction was 40-50%.  Risks and options were discussed with the patient through an interpreter and through his son and he agreed  and signed informed consent.  DESCRIPTION OF PROCEDURE:  With Swan-Ganz and arterial line monitors in place, the patient  underwent general endotracheal anesthesia without incident.  Skin the chest and legs was prepped with Betadine, draped in the usual sterile manner.  Usual  appropriate timeout was performed and we proceeded with endoscopic vein harvesting first in the right thigh.  As the vein approached the knee, it became bifurcated.  Additional segment of greater saphenous vein was then harvested from the left thigh.  A  median sternotomy was performed.  Left internal mammary artery was dissected down as a pedicle graft.  The distal artery was divided and had good free flow.  Pericardium was opened.  Overall, ventricular function appeared preserved with evidence of left  ventricular hypertrophy.  The patient was systemically heparinized.  The ascending aorta was cannulated.  The right atrium was cannulated.  An aortic root vent cardioplegia needle was introduced into the ascending aorta.  The patient was placed on  cardiopulmonary bypass 2.4 liters per minute per meter square.  Sites of anastomoses were inspected and dissected out of the epicardium.  The patient's body temperature was cooled to 32 degrees.  Aortic crossclamp was applied; 600 mL of cold blood  potassium cardioplegia was administered with diastolic arrest of the heart.  Myocardial septal temperatures were monitored throughout the crossclamp.    Attention was turned first to the posterior descending coronary artery.  This vessel arose from the distal circumflex.  The vessel was opened and admitted a 1 mm probe distally.  Using a running 7-0 Prolene, a distal anastomosis was performed with a  segment of reverse saphenous vein graft and brought around the right side of the heart.  The heart was then elevated and the intermediate and distal circumflex branches were identified.  The intermediate was slightly larger than the distal circumflex.   The vessel was  opened.  It admitted a 1.5 mm probe and a diamond type side-to-side anastomosis was performed with a  segment of reverse saphenous vein graft.  The distal extent of the same vein was then carried to the distal circumflex, which was opened  and admitted a 1 mm probe distally and 1.5 mm probe proximally.  Using a running 8-0 Prolene, a distal anastomosis was performed.  Attention was then turned to the left anterior descending coronary artery and the distal third of the LAD.  The vessel was  opened and was diffusely diseased.  Using a running 8-0 Prolene, the left internal mammary artery was anastomosed to the distal left anterior descending coronary artery.  With cross clamp still in place, 2 punch aortotomies were performed and each of the  2 vein grafts were anastomosed to the ascending aorta.  The bulldog was removed from the mammary artery with proper rise in myocardial septal temperature.  The heart was allowed to passively fill and deair.  Proximal anastomoses were completed and the  aortic crossclamp was removed.  The patient spontaneously converted to a sinus rhythm.  Sites of anastomoses were inspected and free of bleeding.  The patient's body temperature rewarmed to 37 degrees.  He was then ventilated and weaned from  cardiopulmonary bypass on low dose dopamine and milrinone.  He remained hemodynamically stable.  He was decannulated in the usual fashion.  Protamine sulfate was administered with operative field hemostatic.  Atrial and ventricular pacing wires had been  applied.  Graft marks were applied.  A left pleural tube and Blake mediastinal drain were left in place.  Pericardium was loosely reapproximated.  Sternum was closed with #6 stainless steel wire.  Fascia was closed with interrupted 0 Vicryl, running 2-0  Vicryl in subcutaneous tissue, 3-0 subcuticular stitch in the skin edges.  Dry dressings were applied.  Sponge and needle count was reported as correct at completion of the procedure.    The patient tolerated the procedure without obvious complication and was transferred to the  Surgical Intensive Care Unit.  The postoperative TEE showed good LV function.    The patient did not require any blood bank blood products during the operative procedure.    The first assistant was required during the case to assist in vein harvesting cannulation, decannulation and construction of the anastomosis and wound closure.  Total cross clamp time 95 minutes.  Total pump time 123 minutes.  AN/NUANCE  D:03/01/2018 T:03/01/2018 JOB:[id number]

## 2018-03-01 NOTE — Progress Notes (Signed)
CARDIAC REHAB PHASE I   PRE:  Rate/Rhythm: 71 SR    BP: sitting 125/70    SaO2: 98 1L, 96 RA  MODE:  Ambulation: 540 ft   POST:  Rate/Rhythm: 78 SR    BP: sitting 127/81     SaO2: 98 RA  Pt reluctant to walk. C/o struggling to breathe.  Able to get to EOB with mod assist. Stood and walked with RW. No c/o breathing while walking and ended up doubling back with hallway again, apparently felt good. Once sitting, c/o SOB. Encouraged IS (only 500 mL) and more walking.  [id number]  Harriet MassonRandi Kristan Kaelum Kissick CES, ACSM 03/01/2018 3:33 PM

## 2018-03-01 NOTE — Progress Notes (Signed)
Physician Discharge Summary       [address]       [place] [id number]             [phone]    Patient ID: Shane Yates MRN: [id number] DOB/AGE: 71/07/1945 71 y.o.  Admit date: 02/20/2018 Discharge date: 03/03/2018  Admission Diagnoses: 1. Non-ST elevation (NSTEMI) myocardial infarction (HCC) 2. Coronary artery disease  Discharge Diagnoses:  1. S/p CABG x 4 2. Atrial fibrillation with RVR (HCC) 3. ABL anemia 4. History of diabetes mellitus without complication (HCC) 5. History of hyperlipidemia 6. History of hypertension 7. History of Etoh dependence (HCC)   Procedure (s):  (Primary)    Procedures   LEFT HEART CATH AND CORONARY ANGIOGRAPHY by Dr. Eldridge DaceVaranasi on 02/23/2018:  Conclusion     Ost LM lesion is 80% stenosed.  Ramus lesion is 80% stenosed.  1st LPL lesion is 75% stenosed.  LPDA lesion is 70% stenosed.  Prox LAD to Mid LAD lesion is 80% stenosed.  Ost 1st Diag lesion is 80% stenosed.  Mid LAD lesion is 75% stenosed.  Mid RCA lesion is 100% stenosed.  The left ventricular systolic function is normal.  LV end diastolic pressure is normal.  The left ventricular ejection fraction is 55-65% by visual estimate.  There is no aortic valve stenosis.   Holding Plavix for CVTS consult.  Continue heparin and aspirin.      Coronary artery bypass grafting x4 with the left internal mammary to the left anterior descending coronary artery, reverse saphenous vein graft sequentially to the intermediate and distal circumflex, reverse saphenous vein graft to  the posterior descending, which arises from the distal circumflex, bilateral greater saphenous endoscopic vein harvesting by Dr. Tyrone SageGerhardt on 02/26/2018.   History of Presenting Illness: 72 year old Hindu speaking male from [place] with medical history significantcad, afib vs  Pac on admission ,  on plavix every 3 days, htn, diabetes,  non-compliance etoh abuse was admitted on August 3 to  the hospitalist service from [facility] for chest pain rule out. In  the ED, ECG showed irregular tachycardia. Troponin was initially negative.  Since admission, his troponin has risen to4.4.  Cardiology was consulted and cardiac catheterization done yesterday.  The patient's compliance with Plavix is unknown, P2 Y 12 testing today is 273.  The patient notes that he had similar episode proximally 5 years ago while in [place], intervention was recommended that time but he was distrustful of the physicians and declined further treatment.  Patient's history is obtained with the help of medical interpreter Sherian MaroonRiz Khan and the patient's son who was present.  The patient has known history of coronary occlusive disease having had  coronary angiography in [place] about 5 years ago. At that time he was told he needed stents but the patient refused.I  Patient's had a previous stroke that primarily affected his left face several years ago while in [place], exact evaluation of this is unknown.  He notes no further recurrence denies amaurosis or TIAs currently.  After extensive discussion through the interpreter , including the current anatomic situation with his coronary artery disease including left main obstruction, Dr. Tyrone SageGerhardt reviewed with him various treatment options including cardiac coronary artery bypass surgery.  After complete discussion about the risks and options of surgery and our recommendation both from cardiology and from cardiac surgery would be to proceed with coronary artery bypass grafting. The patient is agreeable. Pre operative carotid duplex US showed no significant internal carotid  artery stenosis bilaterally.  Brief Hospital Course:  The patient was extubated the evening of surgery without difficulty. He remained afebrile and hemodynamically stable. He was initially A paced. He was weaned off Dopamine, Neo Synephrine, Amiodarone, and Milrinone. Theone Murdoch, a line, chest tubes,  and foley were removed early in the post operative course. Lopressor was started.  He had a fib in surgery so he was transitioned to oral Amiodarone. Per Dr. Tyrone Sage, will continue oral Amiodarone 200 mg daily until seen by cardiology. At which time, they can decide when to stop. Of note he has maintained sinus rhythm and at times sinus bradycardia. He was volume over loaded and diuresed.He had ABL anemia. He did not require a post op transfusion. Last H and H was 10.4 and 32.4. Mild thrombocytopenia as last platelet count up to 144,000. He was weaned off the insulin drip.  He was initially treated with Insulin as creatinine was elevated post op. Metformin was restarted at discharge.  Glimepiride was not restarted secondary to slightly elevated creatinine (1.42 as of 08/13). The patient's glucose remained well controlled.The patient's HGA1C pre op was 6. The patient was felt surgically stable for transfer from the ICU to [facility] for further convalescence on 02/28/2018. He continues to progress with cardiac rehab. He was ambulating on room air. He has been tolerating a diet and has had a bowel movement. Epicardial pacing wires were removed on 03/01/2018. Chest tube sutures will be removed the day of discharge. As discussed with Dr. Tyrone Sage, will continue Lasix 40 mg daily with a low potassium supplement for one week after discharge. Also, at son's request, patient given prescriptions for all medications taking in hospital as his medications at home are from [place]. The patient is felt surgically stable for discharge today.   Latest Vital Signs: Blood pressure (!) 144/75, pulse 65, temperature 98.1 F (36.7 C), temperature source Oral, resp. rate 14, height 5\' 10"  (1.778 m), weight 85.9 kg, SpO2 95 %.  Physical Exam: Cardiovascular: RRR Pulmonary: Slightly diminished at bases Abdomen: Soft, non tender, bowel sounds present. Extremities: Mild bilateral lower extremity edema. Wounds: All wounds are clean and  dry.  Discharge Condition: Stable and discharged to home.  Recent laboratory studies:  Lab Results  Component Value Date   WBC 8.9 03/01/2018   HGB 10.4 (L) 03/01/2018   HCT 32.4 (L) 03/01/2018   MCV 94.5 03/01/2018   PLT 144 (L) 03/01/2018   Lab Results  Component Value Date   NA 140 03/02/2018   K 4.2 03/02/2018   CL 104 03/02/2018   CO2 24 03/02/2018   CREATININE 1.42 (H) 03/02/2018   GLUCOSE 111 (H) 03/02/2018      Diagnostic Studies: Dg Chest 2 View  Result Date: 03/01/2018 CLINICAL DATA:  Shortness of breath, incisional pain EXAM: CHEST - 2 VIEW COMPARISON:  02/28/2018 FINDINGS: There is a trace right pleural effusion. There is a small left pleural effusion. There is no focal consolidation. There is mild bilateral interstitial thickening. There is no pneumothorax. There is stable cardiomegaly. There is evidence of prior CABG. The osseous structures are unremarkable. IMPRESSION: Trace right and small left pleural effusion. Electronically Signed   By: Elige Ko   On: 03/01/2018 11:29   Discharge Instructions    Amb Referral to Cardiac Rehabilitation   Complete by:  As directed    Diagnosis:  CABG   CABG X ___:  4     Discharge Medications: Allergies as of 03/03/2018   No Known Allergies  Medication List    STOP taking these medications   B-complex with vitamin C tablet   PRESCRIPTION MEDICATION   PRESCRIPTION MEDICATION   PRESCRIPTION MEDICATION   PRESCRIPTION MEDICATION   PRESCRIPTION MEDICATION   PRESCRIPTION MEDICATION   PRESCRIPTION MEDICATION   PRESCRIPTION MEDICATION   PRESCRIPTION MEDICATION   PRESCRIPTION MEDICATION     TAKE these medications   amiodarone 200 MG tablet Commonly known as:  PACERONE Take 1 tablet (200 mg total) by mouth daily.   aspirin 325 MG EC tablet Take 1 tablet (325 mg total) by mouth daily.   atorvastatin 20 MG tablet Commonly known as:  LIPITOR Take 1 tablet (20 mg total) by mouth daily at 6 PM.     furosemide 40 MG tablet Commonly known as:  LASIX Take 1 tablet (40 mg total) by mouth daily. For one week then stop.   metFORMIN 500 MG 24 hr tablet Commonly known as:  GLUCOPHAGE-XR Take 1 tablet (500 mg total) by mouth daily with breakfast.   metoprolol tartrate 25 MG tablet Commonly known as:  LOPRESSOR Take 0.5 tablets (12.5 mg total) by mouth 2 (two) times daily.   multivitamin with minerals Tabs tablet Take 1 tablet by mouth daily.   naphazoline-glycerin 0.012-0.2 % Soln Commonly known as:  CLEAR EYES REDNESS Place 1-2 drops into both eyes 4 (four) times daily as needed for eye irritation.   potassium chloride 10 MEQ tablet Commonly known as:  K-DUR Take 1 tablet (10 mEq total) by mouth daily. For one week then stop.   PRESCRIPTION MEDICATION Take 100 mg by mouth daily as needed (erectile dysfunction). Vigore 100 (sildenafil citrate 100 mg) - from [place]   traMADol 50 MG tablet Commonly known as:  ULTRAM Take 1 tablet (50 mg total) by mouth every 6 (six) hours as needed for moderate pain.      The patient has been discharged on:   1.Beta Blocker:  Yes [  x ]                              No   [   ]                              If No, reason:  2.Ace Inhibitor/ARB: Yes [   ]                                     No  [  x  ]                                     If No, reason: Elevated creatinine  3.Statin:   Yes [ x  ]                  No  [   ]                  If No, reason:  4.Ecasa:  Yes  [ x  ]                  No   [   ]                  If No,  reason:  Follow Up Appointments: Follow-up Information    Delight Ovens, MD. Go on 04/05/2018.   Specialty:  Cardiothoracic Surgery Why:  PA/LAT CXR to be taken (at [facility] which is in the same building as Dr. Dennie Maizes office) on 04/05/2018 at 12:30 pm;Appointment time is at 1:00 pm Contact information: [address] [place] [address] [phone]        Nahser, Deloris Ping,  MD Follow up on 03/18/2018.   Specialty:  Cardiology Why:  11:30 am for hospital follow up Contact information: [address] [place] [address] [phone]        Medicl Doctor Follow up.   Why:  Please obtain a medical doctor for treatment of diabetes and other medical issues          Signed: Lelon Huh [facility] 03/03/2018, 2:30 PM

## 2018-03-01 NOTE — Progress Notes (Signed)
  Amiodarone Drug - Drug Interaction Consult Note  Recommendations:  Amiodarone is metabolized by the cytochrome P450 system and therefore has the potential to cause many drug interactions. Amiodarone has an average plasma half-life of 50 days (range 20 to 100 days).   There is potential for drug interactions to occur several weeks or months after stopping treatment and the onset of drug interactions may be slow after initiating amiodarone.   [x]  Statins: Increased risk of myopathy. Simvastatin- restrict dose to 20mg  daily. Other statins: counsel patients to report any muscle pain or weakness immediately.  []  Anticoagulants: Amiodarone can increase anticoagulant effect. Consider warfarin dose reduction. Patients should be monitored closely and the dose of anticoagulant altered accordingly, remembering that amiodarone levels take several weeks to stabilize.  []  Antiepileptics: Amiodarone can increase plasma concentration of phenytoin, the dose should be reduced. Note that small changes in phenytoin dose can result in large changes in levels. Monitor patient and counsel on signs of toxicity.  [x]  Beta blockers: increased risk of bradycardia, AV block and myocardial depression. Sotalol - avoid concomitant use.  []   Calcium channel blockers (diltiazem and verapamil): increased risk of bradycardia, AV block and myocardial depression.  []   Cyclosporine: Amiodarone increases levels of cyclosporine. Reduced dose of cyclosporine is recommended.  []  Digoxin dose should be halved when amiodarone is started.  [x]  Diuretics: increased risk of cardiotoxicity if hypokalemia occurs.  []  Oral hypoglycemic agents (glyburide, glipizide, glimepiride): increased risk of hypoglycemia. Patient's glucose levels should be monitored closely when initiating amiodarone therapy.   []  Drugs that prolong the QT interval:  Torsades de pointes risk may be increased with concurrent use - avoid if possible.  Monitor QTc,  also keep magnesium/potassium WNL if concurrent therapy can't be avoided. Marland Kitchen. Antibiotics: e.g. fluoroquinolones, erythromycin. . Antiarrhythmics: e.g. quinidine, procainamide, disopyramide, sotalol. . Antipsychotics: e.g. phenothiazines, haloperidol.  . Lithium, tricyclic antidepressants, and methadone.  Thank You,  Shane Yates, PharmD, BCPS Clinical Staff Pharmacist Pager [phone]  Shane Yates, [facility] [address]  03/01/2018 9:56 AM

## 2018-03-01 NOTE — Progress Notes (Signed)
Noted maintaining sinus on amiodarone. Agree with plan to decrease to 200 mg daily. On lasix for volume overload - plan to hold given rising creatinine. Cath showed normal LV function, may be able to dose PRN. Would change aspirin to 81 mg daily. Appears to be moving to discharge. No further recommendations at this time. Will sign-off, please call with questions.   CARDIOLOGY RECOMMENDATIONS:  Discharge is anticipated in the next 48 hours. Recommendations for medications and follow up:  Cardiac discharge Medications: Amiodarone 200 mg daily, Lopressor 12.5 mg BID, Crestor 20 mg daily, aspirin 81 mg daily Continue medications as they are currently listed in the [facility]. Exceptions to the above: Change aspirin to 81 mg daily, may be able to dose lasix PRN  Follow Up: The patient's Primary Cardiologist is Dr. Elease HashimotoNahser  Follow up in the office in 2 week(s) with Chelsea AusVin Bhagat, PA-C (who speaks Hindi) and ultimately with Dr. Elease HashimotoNahser.  Chrystie NoseKenneth C. Hilty, MD, [facility] Al[facility] HeightsFACC, FACP  [place]  [facility]  Medical Director of the [facility] Lipid Disorders &  Cardiovascular Risk Reduction Clinic Diplomate of the American Board of Clinical Lipidology Attending Cardiologist  Direct Dial: [phone]  Fax: [phone]  Website:  [email or web address]  Chrystie NoseKenneth C Hilty, MD  8:39 AM 03/01/2018  [facility]

## 2018-03-01 NOTE — Plan of Care (Signed)
  Problem: Education: Goal: Knowledge of General Education information will improve Description: Including pain rating scale, medication(s)/side effects and non-pharmacologic comfort measures Outcome: Progressing   Problem: Coping: Goal: Level of anxiety will decrease Outcome: Progressing   Problem: Education: Goal: Knowledge of disease or condition will improve Outcome: Progressing   

## 2018-03-02 LAB — BASIC METABOLIC PANEL
Anion gap: 12 (ref 5–15)
BUN: 26 mg/dL — AB (ref 8–23)
CO2: 24 mmol/L (ref 22–32)
Calcium: 8.6 mg/dL — ABNORMAL LOW (ref 8.9–10.3)
Chloride: 104 mmol/L (ref 98–111)
Creatinine, Ser: 1.42 mg/dL — ABNORMAL HIGH (ref 0.61–1.24)
GFR calc Af Amer: 56 mL/min — ABNORMAL LOW (ref >60)
GFR calc non Af Amer: 48 mL/min — ABNORMAL LOW (ref >60)
Glucose, Bld: 111 mg/dL — ABNORMAL HIGH (ref 70–99)
POTASSIUM: 4.2 mmol/L (ref 3.5–5.1)
Sodium: 140 mmol/L (ref 135–145)

## 2018-03-02 LAB — GLUCOSE, CAPILLARY
GLUCOSE-CAPILLARY: 185 mg/dL — AB (ref 70–99)
GLUCOSE-CAPILLARY: 166 mg/dL — AB (ref 70–99)
Glucose-Capillary: 122 mg/dL — ABNORMAL HIGH (ref 70–99)
Glucose-Capillary: 190 mg/dL — ABNORMAL HIGH (ref 70–99)
Glucose-Capillary: 129 mg/dL — ABNORMAL HIGH (ref 70–99)

## 2018-03-02 MED ORDER — FUROSEMIDE 10 MG/ML IJ SOLN
40.0000 mg | Freq: Once | INTRAMUSCULAR | Status: AC
  Administered 2018-03-02: 40 mg via INTRAVENOUS
  Filled 2018-03-02: qty 4

## 2018-03-02 MED ORDER — POTASSIUM CHLORIDE CRYS ER 20 MEQ PO TBCR
20.0000 meq | EXTENDED_RELEASE_TABLET | Freq: Once | ORAL | Status: AC
  Administered 2018-03-02: 20 meq via ORAL
  Filled 2018-03-02: qty 1

## 2018-03-02 MED ORDER — FUROSEMIDE 40 MG PO TABS
40.0000 mg | ORAL_TABLET | Freq: Every day | ORAL | Status: DC
  Administered 2018-03-03: 40 mg via ORAL
  Filled 2018-03-02: qty 1

## 2018-03-02 MED FILL — Heparin Sodium (Porcine) Inj 1000 Unit/ML: INTRAMUSCULAR | Qty: 20 | Status: AC

## 2018-03-02 MED FILL — Magnesium Sulfate Inj 50%: INTRAMUSCULAR | Qty: 10 | Status: AC

## 2018-03-02 MED FILL — Electrolyte-R (PH 7.4) Solution: INTRAVENOUS | Qty: 3000 | Status: AC

## 2018-03-02 MED FILL — Heparin Sodium (Porcine) Inj 1000 Unit/ML: INTRAMUSCULAR | Qty: 30 | Status: AC

## 2018-03-02 MED FILL — Mannitol IV Soln 20%: INTRAVENOUS | Qty: 500 | Status: AC

## 2018-03-02 MED FILL — Sodium Bicarbonate IV Soln 8.4%: INTRAVENOUS | Qty: 50 | Status: AC

## 2018-03-02 MED FILL — Sodium Chloride IV Soln 0.9%: INTRAVENOUS | Qty: 2000 | Status: AC

## 2018-03-02 MED FILL — Potassium Chloride Inj 2 mEq/ML: INTRAVENOUS | Qty: 40 | Status: AC

## 2018-03-02 MED FILL — Lidocaine HCl(Cardiac) IV PF Soln Pref Syr 100 MG/5ML (2%): INTRAVENOUS | Qty: 5 | Status: AC

## 2018-03-02 NOTE — Progress Notes (Signed)
Pt was offered bath around 0530. Pt refused.

## 2018-03-02 NOTE — Progress Notes (Addendum)
      [address]       [place] [id number]             [phone]        4 Days Post-Op Procedure(s) (LRB): CORONARY ARTERY BYPASS GRAFTING (CABG), ON PUMP, TIMES FOUR, USING LEFT INTERNAL MAMMARY ARTERY AND ENDOSCOPICALLY HARVESTED BILATERAL GREATER SAPHENOUS VEINS (N/A) TRANSESOPHAGEAL ECHOCARDIOGRAM (TEE) (N/A)  Subjective: Patient at sink washing. Breathing is better.   Objective: Vital signs in last 24 hours: Temp:  [97.9 F (36.6 C)-98.6 F (37 C)] 98.1 F (36.7 C) (08/13 0340) Pulse Rate:  [46-121] 58 (08/13 0340) Cardiac Rhythm: Normal sinus rhythm (08/12 1955) Resp:  [13-22] 18 (08/13 0340) BP: (109-132)/(54-80) 116/66 (08/13 0340) SpO2:  [91 %-99 %] 91 % (08/13 0340) Weight:  [85.3 kg] 85.3 kg (08/13 0354)  Pre op weight 81.5 kg Current Weight  03/02/18 85.3 kg      Intake/Output from previous day: 08/12 0701 - 08/13 0700 In: 960 [P.O.:960] Out: 1053 [Urine:1050; Stool:3]   Physical Exam:  Cardiovascular: RRR Pulmonary: Slightly diminished at bases Abdomen: Soft, non tender, bowel sounds present. Extremities: Mild bilateral lower extremity edema. Wounds: All wounds are clean and dry.  Lab Results: CBC: Recent Labs    02/28/18 0411 03/01/18 0252  WBC 9.2 8.9  HGB 9.9* 10.4*  HCT 31.3* 32.4*  PLT 115* 144*   BMET:  Recent Labs    03/01/18 0252 03/02/18 0412  NA 138 140  K 3.9 4.2  CL 104 104  CO2 24 24  GLUCOSE 130* 111*  BUN 27* 26*  CREATININE 1.59* 1.42*  CALCIUM 8.1* 8.6*    PT/INR:  Lab Results  Component Value Date   INR 1.45 02/26/2018   INR 1.13 02/25/2018   ABG:  INR: Will add last result for INR, ABG once components are confirmed Will add last 4 CBG results once components are confirmed  Assessment/Plan:  1. CV - SR in the high 50's-60's. On Amiodarone 200 mg daily and Lopressor 12.5 mg bid. Not on ACE secondary to creatinine and labile BP, which is improving. 2.  Pulmonary - On room air.  Encourage incentive spirometer. 3. Volume Overload - Received Lasix 40 mg IV yesterday and will give again today. 4.  Acute blood loss anemia - H and H stable at 10.4 and 32.4 5. Creatinine decreased from 1.59 to 1.42 this am. 7. Mild thrombocytopenia-platelets up to 144,000 6. DM-CBGs 122/155/122. On Insulin. Pre op HGA1C 6. As discussed with son, will transition patient to Metformin (on Diapride from [place] pre op) and then Glimepiride once creatinine more normal. 7. Hope to discharge in am  Lelon HuhDonielle M Iron County HospitalZimmermanPA-C 03/02/2018,7:13 AM [phone]  I have seen and examined Shane Yates and agree with the above assessment  and plan.  Delight OvensEdward B Kailani Brass MD [place] [phone] Office [phone] 03/02/2018 3:06 PM

## 2018-03-02 NOTE — Progress Notes (Signed)
Pt ambulated in hallway about 470 ft. Tolerated well. Pt is in the chair. Call bell within reach. Will continue to monitor.   Judithann SheenJuhi Anilah Huck, RN

## 2018-03-02 NOTE — Progress Notes (Signed)
CARDIAC REHAB PHASE I   PRE:  Rate/Rhythm: 67 SR  BP:  Sitting: 117/70      SaO2: 98 RA  MODE:  Ambulation: 940 ft   POST:  Rate/Rhythm: 78 SR  BP:  Sitting: 152/74    SaO2: 97 RA   Pt ambulated 95840ft in hallway standby assist with slow steady gait. Pt c/o low back pain and neck pain with ambulation. RN made aware. Pt also c/o R leg harvest site tightness, attempted to explain it is to be expected with surgery. Will continue to follow, and educate in son's presence if possible.  [phone] Shane Boweneresa  Shane Fontanella, RN BSN 03/02/2018 2:08 PM

## 2018-03-02 NOTE — Progress Notes (Signed)
CARDIAC REHAB PHASE I   Offered to walk with pt, pt states he had walked this am already. Son not at bedside to complete education. Will return as able.  Reynold Boweneresa  Carylon Tamburro, RN BSN 03/02/2018 11:21 AM

## 2018-03-02 NOTE — Progress Notes (Signed)
Physical Therapy Treatment & Discharge Patient Details Name: Shane Yates MRN: [id number] DOB: 05-08-1946 Today's Date: 03/02/2018    History of Present Illness Pt is a 72 y.o. male admitted 02/20/18 with NSTEMI, now s/p CABGx4 on 8/9. PMH includes HTN, ETOH abuse, DM, CAD.    PT Comments    Pt has progressed well with mobility. Ambulating and ascend/descending stairs at supervision-level. Demonstrates good awareness of maintaining sternal precautions. Will have necessary support from family upon return home. Son present throughout session. Discussed importance of continued ambulation, LE therex and energy conservation. Pt and family have no further questions or concerns. Encouraged ambulation often with supervision from family (RN aware). Will d/c acute PT.   Follow Up Recommendations  (OP Cardiac Rehab)     Equipment Recommendations  None recommended by PT    Recommendations for Other Services       Precautions / Restrictions Precautions Precautions: Sternal Precaution Comments: Pt demonstrating good awareness of sternal precautions Restrictions Weight Bearing Restrictions: (sternal precautions)    Mobility  Bed Mobility               General bed mobility comments: Received sitting in recliner  Transfers Overall transfer level: Independent Equipment used: None Transfers: Sit to/from [employer] transfer comment: Does not require UE support to stand; does well holding onto heart pillow  Ambulation/Gait Ambulation/Gait assistance: Supervision Gait Distance (Feet): 500 Feet Assistive device: None Gait Pattern/deviations: Step-through pattern;Decreased stride length Gait velocity: Decreased Gait velocity interpretation: 1.31 - 2.62 ft/sec, indicative of limited community ambulator General Gait Details: Slow, steady amb; supervision for safety. DOE 2/4   Stairs Stairs: Yes Stairs assistance: Supervision Stair Management: One rail Right;Alternating  pattern;Forwards Number of Stairs: 10 General stair comments: Ascend/descended 10 steps with single UE support on rail; supervision for safety. DOE 2/4, but no rest break required   Wheelchair Mobility    Modified Rankin (Stroke Patients Only)       Balance Overall balance assessment: Needs assistance Sitting-balance support: No upper extremity supported Sitting balance-Leahy Scale: Good       Standing balance-Leahy Scale: Good                              Cognition Arousal/Alertness: Awake/alert Behavior During Therapy: WFL for tasks assessed/performed Overall Cognitive Status: Within Functional Limits for tasks assessed                                 General Comments: Pt able to understand and speak very basic [place]      Exercises Other Exercises Other Exercises: Educ on walking program and LE exercise, including standing mini squats    General Comments General comments (skin integrity, edema, etc.): Wife and son present during session      Pertinent Vitals/Pain Pain Assessment: Faces Faces Pain Scale: Hurts a little bit Pain Location: Sternal incision when coughing Pain Descriptors / Indicators: Discomfort Pain Intervention(s): Monitored during session    Home Living                      Prior Function            PT Goals (current goals can now be found in the care plan section) Acute Rehab PT Goals PT Goal Formulation: All assessment and education complete, DC therapy Progress towards  PT goals: Goals met/education completed, patient discharged from PT    Frequency    Min 3X/week      PT Plan Current plan remains appropriate    Co-evaluation              AM-PAC PT "6 Clicks" Daily Activity  Outcome Measure  Difficulty turning over in bed (including adjusting bedclothes, sheets and blankets)?: A Little Difficulty moving from lying on back to sitting on the side of the bed? : A Little Difficulty  sitting down on and standing up from a chair with arms (e.g., wheelchair, bedside commode, etc,.)?: None Help needed moving to and from a bed to chair (including a wheelchair)?: None Help needed walking in hospital room?: None Help needed climbing 3-5 steps with a railing? : A Little 6 Click Score: 21    End of Session   Activity Tolerance: Patient tolerated treatment well Patient left: in chair;with call bell/phone within reach;with family/visitor present Nurse Communication: Mobility status PT Visit Diagnosis: Unsteadiness on feet (R26.81)     Time: 5825-1898 PT Time Calculation (min) (ACUTE ONLY): 15 min  Charges:  $Gait Training: 8-22 mins                    Mabeline Caras, PT, DPT Acute Rehab Services  Pager: [place] 03/02/2018, 4:06 PM

## 2018-03-03 LAB — GLUCOSE, CAPILLARY
GLUCOSE-CAPILLARY: 185 mg/dL — AB (ref 70–99)
Glucose-Capillary: 125 mg/dL — ABNORMAL HIGH (ref 70–99)

## 2018-03-03 MED ORDER — ADULT MULTIVITAMIN W/MINERALS CH: 1.0000 | ORAL_TABLET | Freq: Every day | ORAL | Status: AC

## 2018-03-03 MED ORDER — METOPROLOL TARTRATE 25 MG PO TABS: 12.5000 mg | ORAL_TABLET | Freq: Two times a day (BID) | ORAL | 1 refills | Status: DC

## 2018-03-03 MED ORDER — METFORMIN HCL ER 500 MG PO TB24: 500.0000 mg | ORAL_TABLET | Freq: Every day | ORAL | 1 refills | Status: DC

## 2018-03-03 MED ORDER — FUROSEMIDE 40 MG PO TABS: 40.0000 mg | ORAL_TABLET | Freq: Every day | ORAL | 0 refills | Status: DC

## 2018-03-03 MED ORDER — NAPHAZOLINE-GLYCERIN 0.012-0.2 % OP SOLN: 1.0000 [drp] | Freq: Four times a day (QID) | OPHTHALMIC | 0 refills | Status: DC | PRN

## 2018-03-03 MED ORDER — TRAMADOL HCL 50 MG PO TABS: 50.0000 mg | ORAL_TABLET | Freq: Four times a day (QID) | ORAL | 0 refills | Status: DC | PRN

## 2018-03-03 MED ORDER — AMIODARONE HCL 200 MG PO TABS: 200.0000 mg | ORAL_TABLET | Freq: Every day | ORAL | 1 refills | Status: DC

## 2018-03-03 MED ORDER — POTASSIUM CHLORIDE ER 10 MEQ PO TBCR: 10.0000 meq | EXTENDED_RELEASE_TABLET | Freq: Every day | ORAL | 0 refills | Status: DC

## 2018-03-03 MED ORDER — ASPIRIN 325 MG PO TBEC: 325.0000 mg | DELAYED_RELEASE_TABLET | Freq: Every day | ORAL | 0 refills | Status: DC

## 2018-03-03 MED ORDER — ATORVASTATIN CALCIUM 20 MG PO TABS: 20.0000 mg | ORAL_TABLET | Freq: Every day | ORAL | 1 refills | Status: DC

## 2018-03-03 NOTE — Care Management Note (Signed)
Case Management Note Donn PieriniKristi Eyden Dobie RN,BSN Unit [facility]22 Case Manager  [phone]  Patient Details  Name: Shane Yates MRN: [id number] Date of Birth: 09/09/1945  Subjective/Objective:   Pt admitted with ACS, 3VD, plan for CBAG on 02/26/18                 Action/Plan: PTA pt lived at home, speaks Hindi, son acts as Equities traderinterpreter. CM to follow post op for transition of care needs.   Expected Discharge Date:  03/03/18               Expected Discharge Plan:  Home/Self Care  In-House Referral:  NA  Discharge planning Services  CM Consult  Post Acute Care Choice:  NA Choice offered to:  NA  DME Arranged:    DME Agency:     HH Arranged:    HH Agency:     Status of Service:  Completed, signed off  If discussed at [employer] Length of Stay Meetings, dates discussed:    Discharge Disposition: home/self care   Additional Comments:  03/03/18- 1130- Donn PieriniKristi Warwick Nick RN, CM- pt for transition home today - no CM needs noted for transition home with family.   Darrold SpanWebster, Shamina Etheridge Hall, RN 03/03/2018, 11:37 AM

## 2018-03-03 NOTE — Progress Notes (Signed)
CARDIAC REHAB PHASE I   Completed d/c ed with pt and family. Son at bedside, reviewed importance of daily showering and monitoring incisions. Pts beard seems to be causing some irritation to pts incision, pt educated to where a shirt to prevent friction. Pt encouraged to continue walks and IS use. Exercise guidelines given. Reviewed importance of sternal precautions and restrictions. Pt given heart healthy and diabetic diets. Will refer to CRP II [place].  [phone] Reynold Boweneresa  Maron Stanzione, RN BSN 03/03/2018 10:00 AM

## 2018-03-03 NOTE — Progress Notes (Addendum)
      [address]       [place] [id number]             [phone]        5 Days Post-Op Procedure(s) (LRB): CORONARY ARTERY [employer] GRAFTING (CABG), ON PUMP, TIMES FOUR, USING LEFT INTERNAL MAMMARY ARTERY AND ENDOSCOPICALLY HARVESTED BILATERAL GREATER SAPHENOUS VEINS (N/A) TRANSESOPHAGEAL ECHOCARDIOGRAM (TEE) (N/A)  Subjective: Patient at sink washing. Breathing is better.   Objective: Vital signs in last 24 hours: Temp:  [98 F (36.7 C)-98.7 F (37.1 C)] 98.5 F (36.9 C) (08/14 0432) Pulse Rate:  [61-69] 63 (08/14 0432) Cardiac Rhythm: Normal sinus rhythm (08/13 2315) Resp:  [11-22] 11 (08/14 0432) BP: (118-151)/(72-77) 139/77 (08/14 0432) SpO2:  [96 %-100 %] 96 % (08/14 0432) Weight:  [85.9 kg] 85.9 kg (08/14 0508)  Pre op weight 81.5 kg Current Weight  03/03/18 85.9 kg      Intake/Output from previous day: 08/13 0701 - 08/14 0700 In: 1990 [P.O.:1680; I.V.:310] Out: 2300 [Urine:2300]   Physical Exam:  Cardiovascular: RRR Pulmonary: Slightly diminished at bases Abdomen: Soft, non tender, bowel sounds present. Extremities: Mild bilateral lower extremity edema. Wounds: All wounds are clean and dry.  Lab Results: CBC: Recent Labs    03/01/18 0252  WBC 8.9  HGB 10.4*  HCT 32.4*  PLT 144*   BMET:  Recent Labs    03/01/18 0252 03/02/18 0412  NA 138 140  K 3.9 4.2  CL 104 104  CO2 24 24  GLUCOSE 130* 111*  BUN 27* 26*  CREATININE 1.59* 1.42*  CALCIUM 8.1* 8.6*    PT/INR:  Lab Results  Component Value Date   INR 1.45 02/26/2018   INR 1.13 02/25/2018   ABG:  INR: Will add last result for INR, ABG once components are confirmed Will add last 4 CBG results once components are confirmed  Assessment/Plan:  1. CV - SR in the high 60's-70's. On Amiodarone 200 mg daily and Lopressor 12.5 mg bid.  2.  Pulmonary - On room air. Encourage incentive spirometer. 3. Volume Overload - Continue Lasix 40 mg daily 4.  Acute blood loss  anemia - H and H stable at 10.4 and 32.4 5. Last creatinine decreased from 1.59 to 1.42 this am. 6. Mild thrombocytopenia-platelets up to 144,000 7. DM-CBGs 166/129/125. On Insulin. Pre op HGA1C 6. As discussed with son, will transition patient to Metformin (on Diapride from [place] pre op) at discharge. If  creatinine stays stable, would like to restart Glimeperide after discharge. Patient needs a medical doctor here 8. Hope to discharge   Lelon HuhDonielle M [facility] ServicesZimmermanPA-C 03/03/2018,7:09 AM [phone]  renal function stable Wounds intact Plan d/c today  I have seen and examined Lollie Sailsehal Homan and agree with the above assessment  and plan.  Delight OvensEdward B Asia Favata MD Beeper [phone] Office [phone] 03/03/2018 8:35 AM

## 2018-03-05 ENCOUNTER — Telehealth (HOSPITAL_COMMUNITY): Payer: Self-pay

## 2018-03-05 NOTE — Discharge Summary (Signed)
[address]       [place] [id number]             [phone]               Patient ID: Shane Yates MRN: [id number] DOB/AGE: 71/07/1945 72 y.o.  Admit date: 02/20/2018 Discharge date: 03/03/2018  Admission Diagnoses: 1. Non-ST elevation (NSTEMI) myocardial infarction (HCC) 2. Coronary artery disease  Discharge Diagnoses:  1. S/p CABG x 4 2. Atrial fibrillation with RVR (HCC) 3. ABL anemia 4. History of diabetes mellitus without complication (HCC) 5. History of hyperlipidemia 6. History of hypertension 7. History of Etoh dependence (HCC)   Procedure (s):  (Primary)    Procedures   LEFT HEART CATH AND CORONARY ANGIOGRAPHY by Dr. Eldridge DaceVaranasi on 02/23/2018:  Conclusion     Ost LM lesion is 80% stenosed.  Ramus lesion is 80% stenosed.  1st LPL lesion is 75% stenosed.  LPDA lesion is 70% stenosed.  Prox LAD to Mid LAD lesion is 80% stenosed.  Ost 1st Diag lesion is 80% stenosed.  Mid LAD lesion is 75% stenosed.  Mid RCA lesion is 100% stenosed.  The left ventricular systolic function is normal.  LV end diastolic pressure is normal.  The left ventricular ejection fraction is 55-65% by visual estimate.  There is no aortic valve stenosis.  Holding Plavix for CVTS consult. Continue heparin and aspirin.     Coronary artery bypass grafting x4 with the left internal mammary to the left anterior descending coronary artery, reverse saphenous vein graft sequentially to the intermediate and distal circumflex, reverse saphenous vein graft to  the posterior descending, which arises from the distal circumflex, bilateral greater saphenous endoscopic vein harvesting by Dr. Tyrone SageGerhardt on 02/26/2018.   History of Presenting Illness: 72 year old Hindu speaking male from [place] withmedical history significantcad, afibvs Pac on admission , on plavix every 3 days, htn, diabetes, non-compliance etoh abuse was [facility] [place] [facility] pain rule out.Inthe ED, ECG showed irregular tachycardia. Troponin was initially negative. Since admission, his troponin has risen to4.4.Cardiology was consulted and cardiac catheterization done yesterday.The patient's compliance with Plavix is unknown,P2 Y 12 testing today is 273.The patient notes that he had similar episode proximally 5 years ago while in [place],intervention was recommended that time but he was distrustful of the physicians and declined further treatment.  Patient's history is obtained with the help of medical interpreterRiz Blima RichKhanand the patient's son who was present.  The patienthas known history of coronary occlusive disease having hadcoronary angiography in [place] about 5 years ago. At that time he was told he needed stents but the patient refused.I  Patient's had a previous stroke that primarily affected his left face several years ago [place] in [place],exact evaluation of this is unknown.He notes no further recurrence denies amaurosis or TIAs currently.  After extensive discussion through the interpreter , including the current anatomic situation with his coronary artery disease including left main obstruction, Dr. Tyrone SageGerhardt reviewed with him various treatment options including cardiac coronary artery bypass surgery.After complete discussion about the risks and options of surgery and our recommendation both from cardiology and from cardiac surgery would be to proceed with coronary artery bypass grafting. The patient is agreeable.Pre operative carotid duplex US showed no significant internal carotid artery stenosis bilaterally.  Brief Hospital Course:  The patient was extubated the evening of surgery without difficulty. He remained afebrile and hemodynamically stable. He was initially  A paced. He was weaned off Dopamine, Neo Synephrine, Amiodarone, and Milrinone. Theone Murdoch, a line, chest tubes, and  foley were removed early in the post operative course. Lopressor was started.  He had a fib in surgery so he was transitioned to oral Amiodarone. Per Dr. Tyrone Sage, will continue oral Amiodarone 200 mg daily until seen by cardiology. At which time, they can decide when to stop. Of note he has maintained sinus rhythm and at times sinus bradycardia. He was volume over loaded and diuresed.He had ABL anemia. He did not require a post op transfusion. Last H and H was 10.4 and 32.4. Mild thrombocytopenia as last platelet count up to 144,000. He was weaned off the insulin drip.  He was initially treated with Insulin as creatinine was elevated post op. Metformin was restarted at discharge.  Glimepiride was not restarted secondary to slightly elevated creatinine (1.42 as of 08/13). The patient's glucose remained well controlled.The patient's HGA1C pre op was 6. The patient was felt surgically stable for transfer from the ICU to PCTU for further convalescence on 02/28/2018. He continues to progress with cardiac rehab. He was ambulating on room air. He has been tolerating a diet and has had a bowel movement. Epicardial pacing wires were removed on 03/01/2018. Chest tube sutures will be removed the day of discharge. As discussed with Dr. Tyrone Sage, will continue Lasix 40 mg daily with a low potassium supplement for one week after discharge. Also, at son's request, patient given prescriptions for all medications taking in hospital as his medications at home are from [place]. The patient is felt surgically stable for discharge today.   Latest Vital Signs: Blood pressure (!) 144/75, pulse 65, temperature 98.1 F (36.7 C), temperature source Oral, resp. rate 14, height 5\' 10"  (1.778 m), weight 85.9 kg, SpO2 95 %.  Physical Exam: Cardiovascular: RRR Pulmonary: Slightly diminished at bases Abdomen: Soft, non tender, bowel sounds present. Extremities:Mild bilateral lower extremity edema. Wounds: All wounds are clean and  dry.  Discharge Condition: Stable and discharged to home.  Recent laboratory studies:  RecentLabs       Lab Results  Component Value Date   WBC 8.9 03/01/2018   HGB 10.4 (L) 03/01/2018   HCT 32.4 (L) 03/01/2018   MCV 94.5 03/01/2018   PLT 144 (L) 03/01/2018     RecentLabs       Lab Results  Component Value Date   NA 140 03/02/2018   K 4.2 03/02/2018   CL 104 03/02/2018   CO2 24 03/02/2018   CREATININE 1.42 (H) 03/02/2018   GLUCOSE 111 (H) 03/02/2018        Diagnostic Studies: Dg Chest 2 View  Result Date: 03/01/2018 CLINICAL DATA:  Shortness of breath, incisional pain EXAM: CHEST - 2 VIEW COMPARISON:  02/28/2018 FINDINGS: There is a trace right pleural effusion. There is a small left pleural effusion. There is no focal consolidation. There is mild bilateral interstitial thickening. There is no pneumothorax. There is stable cardiomegaly. There is evidence of prior CABG. The osseous structures are unremarkable. IMPRESSION: Trace right and small left pleural effusion. Electronically Signed   By: Elige Ko   On: 03/01/2018 11:29       Discharge Instructions    Amb Referral to Cardiac Rehabilitation   Complete by:  As directed    Diagnosis:  CABG   CABG X ___:  4     Discharge Medications: Allergies as of 03/03/2018   No Known Allergies  Medication List    STOP taking these medications   B-complex with vitamin C tablet   PRESCRIPTION MEDICATION   PRESCRIPTION MEDICATION   PRESCRIPTION MEDICATION   PRESCRIPTION MEDICATION   PRESCRIPTION MEDICATION   PRESCRIPTION MEDICATION   PRESCRIPTION MEDICATION   PRESCRIPTION MEDICATION   PRESCRIPTION MEDICATION   PRESCRIPTION MEDICATION     TAKE these medications   amiodarone 200 MG tablet Commonly known as:  PACERONE Take 1 tablet (200 mg total) by mouth daily.   aspirin 325 MG EC tablet Take 1 tablet (325 mg total) by mouth daily.   atorvastatin 20 MG  tablet Commonly known as:  LIPITOR Take 1 tablet (20 mg total) by mouth daily at 6 PM.   furosemide 40 MG tablet Commonly known as:  LASIX Take 1 tablet (40 mg total) by mouth daily. For one week then stop.   metFORMIN 500 MG 24 hr tablet Commonly known as:  GLUCOPHAGE-XR Take 1 tablet (500 mg total) by mouth daily with breakfast.   metoprolol tartrate 25 MG tablet Commonly known as:  LOPRESSOR Take 0.5 tablets (12.5 mg total) by mouth 2 (two) times daily.   multivitamin with minerals Tabs tablet Take 1 tablet by mouth daily.   naphazoline-glycerin 0.012-0.2 % Soln Commonly known as:  CLEAR EYES REDNESS Place 1-2 drops into both eyes 4 (four) times daily as needed for eye irritation.   potassium chloride 10 MEQ tablet Commonly known as:  K-DUR Take 1 tablet (10 mEq total) by mouth daily. For one week then stop.   PRESCRIPTION MEDICATION Take 100 mg by mouth daily as needed (erectile dysfunction). Vigore 100 (sildenafil citrate 100 mg) - from [place]   traMADol 50 MG tablet Commonly known as:  ULTRAM Take 1 tablet (50 mg total) by mouth every 6 (six) hours as needed for moderate pain.      The patient has been discharged on:   1.Beta Blocker:  Yes [  x ]                              No   [   ]                              If No, reason:  2.Ace Inhibitor/ARB: Yes [   ]                                     No  [  x  ]                                     If No, reason: Elevated creatinine  3.Statin:   Yes [ x  ]                  No  [   ]                  If No, reason:  4.Ecasa:  Yes  [ x  ]                  No   [   ]                  If No, reason:  Follow Up Appointments:    Follow-up Information    Delight OvensGerhardt, Edward B, MD. Go on 04/05/2018.   Specialty:  Cardiothoracic Surgery Why:  PA/LAT CXR [facility] taken ([facility] which is in the same building as Dr. Dennie MaizesGerhardt's office) on 04/05/2018 at 12:30 pm;Appointment time is at 1:00  pm Contact information: [address] [place] [address] [phone]        Nahser, Deloris PingPhilip J, MD Follow up on 03/18/2018.   Specialty:  Cardiology Why:  11:30 am for hospital follow up Contact information: [address] [place] [address] [phone]        Medicl Doctor Follow up.   Why:  Please obtain a medical doctor for treatment of diabetes and other medical issues          Signed: Lelon HuhDonielle M [facility] HospitalZimmermanPA-C 03/03/2018, 2:30 PM

## 2018-03-05 NOTE — Telephone Encounter (Signed)
Pt insurance is active and benefits verified through [employer]. Co-pay $0.00, DED $200.00/$200.00 met, out of pocket $600.00/$600.00 met, co-insurance 30%. No pre-authorization. Passport, 03/05/18 @ 10:45AM, [id number]  Will contact patient to see if he is interested in the Cardiac Rehab Program. If interested, patient will need to complete follow up appt. Once completed, patient will be contacted for scheduling upon review by the RN Navigator.

## 2018-03-05 NOTE — Telephone Encounter (Signed)
Called patient to see if he is interested in the Cardiac Rehab Program. Patient expressed interest. Explained scheduling process and went over insurance, patient verbalized understanding. Will contact patient for scheduling once f/u has been completed.  °

## 2018-03-08 ENCOUNTER — Emergency Department (HOSPITAL_BASED_OUTPATIENT_CLINIC_OR_DEPARTMENT_OTHER): Payer: BLUE CROSS/BLUE SHIELD

## 2018-03-08 ENCOUNTER — Emergency Department (HOSPITAL_BASED_OUTPATIENT_CLINIC_OR_DEPARTMENT_OTHER)
Admission: EM | Admit: 2018-03-08 | Discharge: 2018-03-08 | Disposition: A | Discharge status: Home / Self Care | Payer: BLUE CROSS/BLUE SHIELD | Attending: Emergency Medicine | Admitting: Emergency Medicine

## 2018-03-08 ENCOUNTER — Other Ambulatory Visit: Payer: Self-pay

## 2018-03-08 ENCOUNTER — Encounter (HOSPITAL_BASED_OUTPATIENT_CLINIC_OR_DEPARTMENT_OTHER): Payer: Self-pay

## 2018-03-08 DIAGNOSIS — R21 Rash and other nonspecific skin eruption: Secondary | ICD-10-CM | POA: Diagnosis present

## 2018-03-08 DIAGNOSIS — I2510 Atherosclerotic heart disease of native coronary artery without angina pectoris: Secondary | ICD-10-CM | POA: Diagnosis not present

## 2018-03-08 DIAGNOSIS — J90 Pleural effusion, not elsewhere classified: Secondary | ICD-10-CM | POA: Diagnosis not present

## 2018-03-08 DIAGNOSIS — E119 Type 2 diabetes mellitus without complications: Secondary | ICD-10-CM | POA: Diagnosis not present

## 2018-03-08 DIAGNOSIS — M79604 Pain in right leg: Secondary | ICD-10-CM | POA: Insufficient documentation

## 2018-03-08 DIAGNOSIS — I10 Essential (primary) hypertension: Secondary | ICD-10-CM | POA: Insufficient documentation

## 2018-03-08 DIAGNOSIS — M7989 Other specified soft tissue disorders: Secondary | ICD-10-CM | POA: Diagnosis not present

## 2018-03-08 DIAGNOSIS — L03115 Cellulitis of right lower limb: Secondary | ICD-10-CM | POA: Diagnosis not present

## 2018-03-08 DIAGNOSIS — Z951 Presence of aortocoronary bypass graft: Secondary | ICD-10-CM | POA: Insufficient documentation

## 2018-03-08 DIAGNOSIS — Z7982 Long term (current) use of aspirin: Secondary | ICD-10-CM | POA: Diagnosis not present

## 2018-03-08 DIAGNOSIS — M79661 Pain in right lower leg: Secondary | ICD-10-CM | POA: Diagnosis not present

## 2018-03-08 DIAGNOSIS — R0602 Shortness of breath: Secondary | ICD-10-CM | POA: Diagnosis not present

## 2018-03-08 DIAGNOSIS — Z79899 Other long term (current) drug therapy: Secondary | ICD-10-CM | POA: Diagnosis not present

## 2018-03-08 DIAGNOSIS — R5383 Other fatigue: Secondary | ICD-10-CM | POA: Diagnosis not present

## 2018-03-08 LAB — COMPREHENSIVE METABOLIC PANEL
ALK PHOS: 153 U/L — AB (ref 38–126)
ALT: 40 U/L (ref 0–44)
ANION GAP: 10 (ref 5–15)
AST: 48 U/L — ABNORMAL HIGH (ref 15–41)
Albumin: 3.9 g/dL (ref 3.5–5.0)
BILIRUBIN TOTAL: 1.4 mg/dL — AB (ref 0.3–1.2)
BUN: 17 mg/dL (ref 8–23)
CO2: 30 mmol/L (ref 22–32)
Calcium: 9.2 mg/dL (ref 8.9–10.3)
Chloride: 96 mmol/L — ABNORMAL LOW (ref 98–111)
Creatinine, Ser: 1.14 mg/dL (ref 0.61–1.24)
GFR calc Af Amer: >60 mL/min (ref >60)
GFR calc non Af Amer: >60 mL/min (ref >60)
Glucose, Bld: 118 mg/dL — ABNORMAL HIGH (ref 70–99)
POTASSIUM: 3.7 mmol/L (ref 3.5–5.1)
Sodium: 136 mmol/L (ref 135–145)
TOTAL PROTEIN: 7.4 g/dL (ref 6.5–8.1)

## 2018-03-08 LAB — CBC WITH DIFFERENTIAL/PLATELET
BASOS PCT: 0 %
Basophils Absolute: 0 10*3/uL (ref 0.0–0.1)
Eosinophils Absolute: 0.5 10*3/uL (ref 0.0–0.7)
Eosinophils Relative: 6 %
HEMATOCRIT: 36.2 % — AB (ref 39.0–52.0)
Hemoglobin: 11.7 g/dL — ABNORMAL LOW (ref 13.0–17.0)
LYMPHS PCT: 22 %
Lymphs Abs: 2 10*3/uL (ref 0.7–4.0)
MCH: 30.4 pg (ref 26.0–34.0)
MCHC: 32.3 g/dL (ref 30.0–36.0)
MCV: 94 fL (ref 78.0–100.0)
MONO ABS: 0.8 10*3/uL (ref 0.1–1.0)
Monocytes Relative: 9 %
NEUTROS ABS: 5.7 10*3/uL (ref 1.7–7.7)
Neutrophils Relative %: 63 %
Platelets: 459 10*3/uL — ABNORMAL HIGH (ref 150–400)
RBC: 3.85 MIL/uL — ABNORMAL LOW (ref 4.22–5.81)
RDW: 13 % (ref 11.5–15.5)
WBC: 9 10*3/uL (ref 4.0–10.5)

## 2018-03-08 LAB — URINALYSIS, ROUTINE W REFLEX MICROSCOPIC
BILIRUBIN URINE: NEGATIVE
Glucose, UA: NEGATIVE mg/dL
Hgb urine dipstick: NEGATIVE
Ketones, ur: NEGATIVE mg/dL
Leukocytes, UA: NEGATIVE
NITRITE: NEGATIVE
Protein, ur: NEGATIVE mg/dL
Specific Gravity, Urine: 1.01 (ref 1.005–1.030)
pH: 6.5 (ref 5.0–8.0)

## 2018-03-08 LAB — BRAIN NATRIURETIC PEPTIDE: B Natriuretic Peptide: 431.9 pg/mL — ABNORMAL HIGH (ref 0.0–100.0)

## 2018-03-08 LAB — TROPONIN I: TROPONIN I: 0.06 ng/mL — AB (ref <0.03)

## 2018-03-08 MED ORDER — CEPHALEXIN 500 MG PO CAPS: 500.0000 mg | ORAL_CAPSULE | Freq: Four times a day (QID) | ORAL | 0 refills | Status: AC

## 2018-03-08 MED ORDER — CEPHALEXIN 250 MG PO CAPS
500.0000 mg | ORAL_CAPSULE | Freq: Once | ORAL | Status: AC
Start: 2018-03-08 — End: 2018-03-08
  Administered 2018-03-08: 500 mg via ORAL
  Filled 2018-03-08: qty 2

## 2018-03-08 NOTE — ED Notes (Signed)
Oxygen 95% while ambulating  

## 2018-03-08 NOTE — ED Notes (Signed)
Pt ambulating to rest room.

## 2018-03-08 NOTE — ED Notes (Signed)
Date and time results received: 03/08/18 10:05 PM  (use smartphrase ".now" to insert current time)  Test: troponin Critical Value: 0.06  Name of Provider Notified: Dr. Julieanne Mansonegler  Orders Received? Or Actions Taken?: MD aware. No orders at this time.

## 2018-03-08 NOTE — ED Triage Notes (Signed)
Per son pt had CABG 8/9-c/o drainage from right LE site-slight drainage noted at most distal area-4x4/kling applied-NAD-slow gait

## 2018-03-08 NOTE — ED Provider Notes (Signed)
[facility] EMERGENCY DEPARTMENT Provider Note   CSN: [id number] Arrival date & time: 03/08/18  1820     History   Chief Complaint Chief Complaint  Patient presents with  . Post-op Problem    HPI Shane Yates is a 72 y.o. male.  The history is provided by the patient, medical records and a relative. The history is limited by a language barrier.  Rash   This is a new problem. The current episode started more than 1 week ago. The problem has been gradually worsening. The problem is associated with an unknown (recent surgery) factor. There has been no fever. The rash is present on the right lower leg. The pain is mild. The pain has been constant since onset. Associated symptoms include pain. Pertinent negatives include no blisters. He has tried nothing for the symptoms.    Past Medical History:  Diagnosis Date  . Atrial fibrillation with RVR (HCC)   . Chest pain   . Coronary artery disease   . Diabetes mellitus without complication (HCC)   . EtOH dependence (HCC)   . Hyperlipidemia   . Hypertension   . MI (myocardial infarction) [facility])     Patient Active Problem List   Diagnosis Date Noted  . 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 Non-ST elevation (NSTEMI) myocardial infarction (HCC)   . Tachycardia, unspecified 02/20/2018  . Coronary artery disease   . Diabetes mellitus without complication (HCC)   . Hyperlipidemia   . Hypertension   . Chest pain   . Atrial fibrillation with RVR (HCC)   . EtOH dependence [facility])     Past Surgical History:  Procedure Laterality Date  . APPENDECTOMY    . CORONARY ARTERY BYPASS GRAFT N/A 02/26/2018   Procedure: CORONARY ARTERY BYPASS GRAFTING (CABG), ON PUMP, TIMES FOUR, USING LEFT INTERNAL MAMMARY ARTERY AND ENDOSCOPICALLY HARVESTED BILATERAL GREATER SAPHENOUS VEINS;  Surgeon: Delight Ovens, MD;  Location: [facility] OR;  Service: Open Heart Surgery;  Laterality: N/A;  Right SVG SEQ to Distal Circumflex and Ramus, Left SVG to PDA, LIMA to LAD    . LEFT HEART CATH AND CORONARY ANGIOGRAPHY N/A 02/23/2018   Procedure: LEFT HEART CATH AND CORONARY ANGIOGRAPHY;  Surgeon: Corky Crafts, MD;  Location: [facility] INVASIVE CV LAB;  Service: Cardiovascular;  Laterality: N/A;  . TEE WITHOUT CARDIOVERSION N/A 02/26/2018   Procedure: TRANSESOPHAGEAL ECHOCARDIOGRAM (TEE);  Surgeon: Delight Ovens, MD;  Location: [facility] OR;  Service: Open Heart Surgery;  Laterality: N/A;        Home Medications    Prior to Admission medications   Medication Sig Start Date End Date Taking? Authorizing Provider  amiodarone (PACERONE) 200 MG tablet Take 1 tablet (200 mg total) by mouth daily. 03/03/18   Ardelle Balls, PA-C  aspirin EC 325 MG EC tablet Take 1 tablet (325 mg total) by mouth daily. 03/03/18   Ardelle Balls, PA-C  atorvastatin (LIPITOR) 20 MG tablet Take 1 tablet (20 mg total) by mouth daily at 6 PM. 03/03/18   Ardelle Balls, PA-C  furosemide (LASIX) 40 MG tablet Take 1 tablet (40 mg total) by mouth daily. For one week then stop. 03/03/18   Ardelle Balls, PA-C  metFORMIN (GLUCOPHAGE-XR) 500 MG 24 hr tablet Take 1 tablet (500 mg total) by mouth daily with breakfast. 03/03/18   Ardelle Balls, PA-C  metoprolol tartrate (LOPRESSOR) 25 MG tablet Take 0.5 tablets (12.5 mg total) by mouth 2 (two) times daily. 03/03/18   Doree Fudge  M, PA-C  Multiple Vitamin (MULTIVITAMIN WITH MINERALS) TABS tablet Take 1 tablet by mouth daily. 03/03/18   Ardelle BallsZimmerman, Donielle M, PA-C  naphazoline-glycerin (CLEAR EYES REDNESS) 0.012-0.2 % SOLN Place 1-2 drops into both eyes 4 (four) times daily as needed for eye irritation. 03/03/18   Ardelle BallsZimmerman, Donielle M, PA-C  potassium chloride (K-DUR) 10 MEQ tablet Take 1 tablet (10 mEq total) by mouth daily. For one week then stop. 03/03/18   Ardelle BallsZimmerman, Donielle M, PA-C  PRESCRIPTION MEDICATION Take 100 mg by mouth daily as needed (erectile dysfunction). Vigore 100 (sildenafil citrate 100 mg) - from [place]     [provider]  traMADol (ULTRAM) 50 MG tablet Take 1 tablet (50 mg total) by mouth every 6 (six) hours as needed for moderate pain. 03/03/18   Ardelle BallsZimmerman, Donielle M, PA-C    Family History Family History  Problem Relation Age of Onset  . Hypertension Father     Social History Social History   Tobacco Use  . Smoking status: Never Smoker  . Smokeless tobacco: Never Used  Substance Use Topics  . Alcohol use: Yes    Comment: Per son drinks nightly about 100ml of liquor, last drink last night 02/19/18  . Drug use: Never     Allergies   Patient has no known allergies.   Review of Systems Review of Systems  Constitutional: Positive for fatigue. Negative for chills, diaphoresis and fever.  HENT: Negative for congestion and rhinorrhea.   Eyes: Negative for visual disturbance.  Respiratory: Positive for shortness of breath. Negative for cough and chest tightness.   Cardiovascular: Positive for leg swelling. Negative for chest pain and palpitations.  Gastrointestinal: Negative for abdominal pain, constipation, diarrhea, nausea and vomiting.  Genitourinary: Negative for dysuria, flank pain and frequency.  Musculoskeletal: Negative for back pain, neck pain and neck stiffness.  Skin: Positive for rash and wound.  Neurological: Negative for facial asymmetry and light-headedness.  Psychiatric/Behavioral: Negative for agitation.  All other systems reviewed and are negative.    Physical Exam Updated Vital Signs BP 137/78 (BP Location: Right Arm)   Pulse 66   Temp 98.5 F (36.9 C) (Oral)   Resp 18   Ht 5\' 10"  (1.778 m)   Wt 81.1 kg   SpO2 98%   BMI 25.67 kg/m   Physical Exam  Constitutional: He appears well-developed and well-nourished. No distress.  HENT:  Head: Normocephalic and atraumatic.  Nose: Nose normal.  Mouth/Throat: Oropharynx is clear and moist. No oropharyngeal exudate.  Eyes: Pupils are equal, round, and reactive to light. Conjunctivae and EOM are  normal.  Neck: Neck supple.  Cardiovascular: Normal rate and regular rhythm.  No murmur heard. Pulmonary/Chest: Effort normal. No respiratory distress. He has no wheezes. He has rales. He exhibits no tenderness.  Abdominal: Soft. There is no tenderness. There is no rebound. No hernia.  Musculoskeletal: He exhibits edema and tenderness. He exhibits no deformity.       Right lower leg: He exhibits tenderness and laceration.       Legs: Lymphadenopathy:    He has no cervical adenopathy.  Neurological: He is alert. No sensory deficit. He exhibits normal muscle tone.  Skin: Skin is warm and dry. Capillary refill takes less than 2 seconds. Rash noted. He is not diaphoretic. No pallor.  Psychiatric: He has a normal mood and affect.  Nursing note and vitals reviewed.    ED Treatments / Results  Labs (all labs ordered are listed, but only abnormal results are displayed) Labs  Reviewed  CBC WITH DIFFERENTIAL/PLATELET - Abnormal; Notable for the following components:      Result Value   RBC 3.85 (*)    Hemoglobin 11.7 (*)    HCT 36.2 (*)    Platelets 459 (*)    All other components within normal limits  COMPREHENSIVE METABOLIC PANEL - Abnormal; Notable for the following components:   Chloride 96 (*)    Glucose, Bld 118 (*)    AST 48 (*)    Alkaline Phosphatase 153 (*)    Total Bilirubin 1.4 (*)    All other components within normal limits  TROPONIN I - Abnormal; Notable for the following components:   Troponin I 0.06 (*)    All other components within normal limits  BRAIN NATRIURETIC PEPTIDE - Abnormal; Notable for the following components:   B Natriuretic Peptide 431.9 (*)    All other components within normal limits  URINE CULTURE  URINALYSIS, ROUTINE W REFLEX MICROSCOPIC    EKG EKG Interpretation  Date/Time:  Monday March 08 2018 20:33:51 EDT Ventricular Rate:  67 PR Interval:    QRS Duration: 86 QT Interval:  503 QTC Calculation: 532 R Axis:   26 Text Interpretation:   Sinus rhythm Low voltage, extremity leads Nonspecific T abnormalities, lateral leads Prolonged QT interval when compared to prior, longer QTc.  No STEMI Confirmed by Theda Belfast ([id number]) on 03/08/2018 8:44:42 PM   Radiology Dg Chest 2 View  Result Date: 03/08/2018 CLINICAL DATA:  Follow-up CABG.  Weakness and leg swelling. EXAM: CHEST - 2 VIEW COMPARISON:  03/01/2018 FINDINGS: Previous median sternotomy and CABG. Mild cardiac enlargement. Aortic atherosclerosis. Left pleural effusion is smaller than seen previously. There is some left lower lobe volume loss. Tiny amount of pleural fluid in the right posterior costophrenic angle with minimal right base volume loss. IMPRESSION: Small effusions, more on the left than the right, improved since the discharge film. Mild associated basilar atelectasis left more than right. Electronically Signed   By: Paulina Fusi M.D.   On: 03/08/2018 20:11   US Venous Img Lower Unilateral Right  Result Date: 03/08/2018 CLINICAL DATA:  Losing fluid from RIGHT lower extremity, underwent CABG 10 days ago with vein harvest from RIGHT lower extremity, leg pain and swelling EXAM: RIGHT LOWER EXTREMITY VENOUS DOPPLER ULTRASOUND TECHNIQUE: Gray-scale sonography with graded compression, as well as color Doppler and duplex ultrasound were performed to evaluate the lower extremity deep venous systems from the level of the common femoral vein and including the common femoral, femoral, profunda femoral, popliteal and calf veins including the posterior tibial, peroneal and gastrocnemius veins when visible. The superficial great saphenous vein was also interrogated. Spectral Doppler was utilized to evaluate flow at rest and with distal augmentation maneuvers in the common femoral, femoral and popliteal veins. COMPARISON:  None FINDINGS: Contralateral Common Femoral Vein: Respiratory phasicity is normal and symmetric with the symptomatic side. No evidence of thrombus. Normal compressibility.  Common Femoral Vein: No evidence of thrombus. Normal compressibility, respiratory phasicity and response to augmentation. Saphenofemoral Junction: No evidence of thrombus. Normal compressibility and flow on color Doppler imaging. Profunda Femoral Vein: No evidence of thrombus. Normal compressibility and flow on color Doppler imaging. Femoral Vein: No evidence of thrombus. Normal compressibility, respiratory phasicity and response to augmentation. Popliteal Vein: No evidence of thrombus. Normal compressibility, respiratory phasicity and response to augmentation. Calf Veins: No evidence of thrombus. Normal compressibility and flow on color Doppler imaging. Superficial Great Saphenous Vein: No evidence of thrombus. Normal compressibility. Venous Reflux:  None. Other Findings: Minimal amount of subcutaneous fluid is identified in the upper RIGHT thigh and in the RIGHT calf question related to vein harvest site. IMPRESSION: No evidence of deep venous thrombosis in the RIGHT lower extremity. Small amount of subcutaneous fluid is seen in the upper RIGHT thigh and in the RIGHT calf question related to vein harvest. Electronically Signed   By: Ulyses SouthwardMark  Boles M.D.   On: 03/08/2018 20:45    Procedures Procedures (including critical care time)  Medications Ordered in ED Medications  cephALEXin (KEFLEX) capsule 500 mg (500 mg Oral Given 03/08/18 2032)     Initial Impression / Assessment and Plan / ED Course  I have reviewed the triage vital signs and the nursing notes.  Pertinent labs & imaging results that were available during my care of the patient were reviewed by me and considered in my medical decision making (see chart for details).     Shane Sailsehal Scoville is a 72 y.o. male with a past medical history significant for hypertension, hyperlipidemia, CAD with CABG 10 days ago, diabetes and prior Afib with RVR who presents for right leg pain, swelling, purulence from surgical site, as well as fatigue and shortness of  breath.  Patient reports that 10 days ago he had a CABG and has been recovering.  He reports that he has began having shortness of breath over the last few days.  He denies chest pain.  He says that his right leg is begun to be more swollen although they did harvest a vein from his right leg.  He says that the lowest wound has been draining purulence and has surrounding redness.  He reports some pain around that location.  He denies numbness or tingling.  He reports that he is feeling generalized fatigue and short of breath.  He denies any chest pain, palpitations, nausea, vomiting, or diaphoresis.  He denies fevers.  On exam, patient has well-appearing surgical sites on the right medial leg.  Patient's low site does have some dried purulence around the site and surrounding erythema.  Minimal tenderness and no crepitance was appreciated.  I suspect a mild cellulitis from the site.  Patient will be given a dose of Keflex.  Patient's lungs had crackles and decreased breath sounds in the bases.  No murmur was appreciated.  Clinical I suspect patient is fluid overloaded.  Patient will have a chest x-ray and laboratory testing to look for etiology of his fatigue and shortness of breath.  He will also have a troponin and EKG.  Next  Anticipate reassessment after work-up.  If work-up is reassuring, patient will likely go home with antibiotics for the cellulitis however if ab normalities are found or he is hypoxic, patient may need further management.  Patient's initial troponin was elevated however it is greatly improved from his last value.  He had no chest pain or shortness of breath while in the ED.  Patient's lab testing showed improved anemia.  Metabolic panel shows slight elevation in liver function but normal kidney function.  Electrolytes otherwise reassuring.  Urinalysis did not show infection.  BNP elevated however there is no comparison at 431.  Ultrasound showed no evidence of DVT.  Clinically I  suspect patient has a very mild cellulitis which she will be given prescription for antibiotic's.  Patient, myself, and son had a discussion for disposition given the continued mild shortness of breath and the fluid overload.  We decided to have the patient ambulate and see if he gets symptomatic.  He had no hypoxia tachycardia and did not feel short of breath or fatigued when he ambulated around the emergency department.  He was instructed to continue taking his fluid pills and follow-up with his cardiologist and PCP in the next several days as I do not think he needs admission for fluid overload given well appearance.  Patient will take the antibiotics with a small cellulitis and follow-up.  Patient and son agreed with plan of care and understood return precautions.  He had no other questions or concerns and was discharged in good condition.   Final Clinical Impressions(s) / ED Diagnoses   Final diagnoses:  Cellulitis of right lower extremity    ED Discharge Orders         Ordered    cephALEXin (KEFLEX) 500 MG capsule  4 times daily     03/08/18 2313          Clinical Impression: 1. Cellulitis of right lower extremity     Disposition: Discharge  Condition: Good  I have discussed the results, Dx and Tx plan with the pt(& family if present). He/she/they expressed understanding and agree(s) with the plan. Discharge instructions discussed at great length. Strict return precautions discussed and pt &/or family have verbalized understanding of the instructions. No further questions at time of discharge.    Discharge Medication List as of 03/08/2018 11:14 PM    START taking these medications   Details  cephALEXin (KEFLEX) 500 MG capsule Take 1 capsule (500 mg total) by mouth 4 (four) times daily for 7 days., Starting Mon 03/08/2018, Until Mon 03/15/2018, Print        Follow Up: Your PCP and your cardiologist     [facility] [place] [facility]Y DEPARTMENT [address] [id number] [id number] [phone]       Tegeler, Canary Brim, MD 03/09/18 [phone]

## 2018-03-08 NOTE — ED Notes (Signed)
Pt is in radiology.

## 2018-03-08 NOTE — Discharge Instructions (Signed)
Your exam today was consistent with a mild cellulitis at the site of the recent surgery.  Please take the antibiotics to treat this.  Please follow-up with your primary doctor and cardiologist in the next several days.  Given your well appearance and ability to ambulate without difficulty, we did not feel he required admission for the fluid overload.  If any symptoms change or worsen, please return to the nearest emergency department.

## 2018-03-10 LAB — URINE CULTURE: CULTURE: NO GROWTH

## 2018-03-15 ENCOUNTER — Telehealth: Payer: Self-pay | Admitting: Cardiovascular Disease

## 2018-03-15 ENCOUNTER — Telehealth: Payer: Self-pay

## 2018-03-15 ENCOUNTER — Ambulatory Visit (INDEPENDENT_AMBULATORY_CARE_PROVIDER_SITE_OTHER): Payer: Self-pay | Admitting: Physician Assistant

## 2018-03-15 VITALS — BP 139/73 | HR 64 | Temp 98.6°F | Resp 20 | Ht 70.0 in | Wt 181.0 lb

## 2018-03-15 DIAGNOSIS — Z951 Presence of aortocoronary bypass graft: Secondary | ICD-10-CM

## 2018-03-15 DIAGNOSIS — I2510 Atherosclerotic heart disease of native coronary artery without angina pectoris: Secondary | ICD-10-CM

## 2018-03-15 MED ORDER — FUROSEMIDE 20 MG PO TABS: 20.0000 mg | ORAL_TABLET | Freq: Every day | ORAL | 0 refills | Status: DC

## 2018-03-15 MED ORDER — POTASSIUM CHLORIDE ER 10 MEQ PO TBCR: 10.0000 meq | EXTENDED_RELEASE_TABLET | Freq: Every day | ORAL | 0 refills | Status: DC

## 2018-03-15 NOTE — Telephone Encounter (Signed)
New Message    Patients daughter in law is calling because she says that patient is have some clear drainage coming from his incision site. Please call and a voicemail can be left.

## 2018-03-15 NOTE — Telephone Encounter (Signed)
Spoke with patient's family member and advised him to call the surgeon's office regarding drainage from the incision site. They verbalized understanding and agreement.

## 2018-03-15 NOTE — Telephone Encounter (Signed)
Patient's daughter-in-law called and wished to move up his appointment because she stated that he was having continuous clear drainage coming [facility].  She stated that he is s/p  CABG x4 on 02/26/18 and the site has not stopped draining.  I advised her that the appointment that he has on the 29th, this Thursday is with his Cardiologist, Dr. Elease HashimotoNahser and that appointment does not need to be cancelled as we are not the same office.  She stated that she wished all of his appointments were on the same day due to work schedules.  I advised her that we could see him this Tuesday, 27th with a PA to have the site looked at.  But I could not guarantee all appointment wold be on the same day.  She then stated that she did not want him to see a PA and she wanted him to see a doctor.  She then quickly said she would call back, and hung up the phone.  Patient does not have an appointment scheduled for wound check.  Will continue to follow if there is a call back.

## 2018-03-15 NOTE — Progress Notes (Signed)
HPI:  Patient returns for follow up of right lower extremity wound continues to drain (clearish in color). The patient is s/p CABG x 4 on 02/26/2018 by Dr. Tyrone SageGerhardt. According to the son (as patient does not speak [place]), his [facility]. Duplex US showed no DVT of the RLE. There was a small amount of subcutaneous fluid in the right upper thigh and calf (site of EVH). CBC showed WBC  "WNL" at 9000. He had a chest x ray which showed small bilateral pleural effusions L>R and mild bibasilar atelectasis. He was put on Cephalexin for cellulitis of the RLE. Son states he has not had fever. Son does report he has some shortness of breath with ambulation, he feels cold most of the time, and he has some numbness left of the sternal wound.   Current Outpatient Medications  Medication Sig Dispense Refill  . amiodarone (PACERONE) 200 MG tablet Take 1 tablet (200 mg total) by mouth daily. 30 tablet 1  . aspirin EC 325 MG EC tablet Take 1 tablet (325 mg total) by mouth daily. 30 tablet 0  . atorvastatin (LIPITOR) 20 MG tablet Take 1 tablet (20 mg total) by mouth daily at 6 PM. 30 tablet 1  . cephALEXin (KEFLEX) 500 MG capsule Take 1 capsule (500 mg total) by mouth 4 (four) times daily for 7 days. 28 capsule 0  . metFORMIN (GLUCOPHAGE-XR) 500 MG 24 hr tablet Take 1 tablet (500 mg total) by mouth daily with breakfast. 30 tablet 1  . metoprolol tartrate (LOPRESSOR) 25 MG tablet Take 0.5 tablets (12.5 mg total) by mouth 2 (two) times daily. 30 tablet 1  . Multiple Vitamin (MULTIVITAMIN WITH MINERALS) TABS tablet Take 1 tablet by mouth daily.    . naphazoline-glycerin (CLEAR EYES REDNESS) 0.012-0.2 % SOLN Place 1-2 drops into both eyes 4 (four) times daily as needed for eye irritation.  0  . PRESCRIPTION MEDICATION Take 100 mg by mouth daily as needed (erectile dysfunction). Vigore 100 (sildenafil citrate 100 mg) - from [place]    . traMADol (ULTRAM) 50 MG tablet Take 1 tablet  (50 mg total) by mouth every 6 (six) hours as needed for moderate pain. 28 tablet 0  . furosemide (LASIX) 20 MG tablet Take 1 tablet (20 mg total) by mouth daily. 20 tablet 0  . potassium chloride (K-DUR) 10 MEQ tablet Take 1 tablet (10 mEq total) by mouth daily. 20 tablet 0  Vital Signs: BP 139/73, HR 64, RR 20, Oxygenation 98% on room air, temp 98.6   Physical Exam: CV-RRR Pulmonary-Mostly clear to auscultation bilaterally Extremities-Mild RLE edema Wounds-Sternal wound is clean and dry. RLE wounds have eschars and lower wound has clear drainage. There is NO evidence of cellulitis.   Impression and Plan: Dr. Tyrone SageGerhardt saw and evaluated Shane Yates. Son was advised to cleanse the right lower extremity wound (the one that is draining) with Betadine swab, apply dry 4x4s, and tape. Change daily and PRN. He likely has a seroma and hopefully, drainage will stop with time. He was instructed to finish Cephalexin and he does NOT need any other antibiotic at this time. Regarding his numbness, likely related to the takedown of the LIMA. Patient was sent home on one week of Lasix (with a potassium supplement) and has finished this. Because of his shortness of breath with exertion, he was given Lasix 20 mg daily along with a potassium supplement for 2 more weeks.  His son will call if he  develops fever, redness, or worsening drainage. He has an appointment to see the cardiologist this week and our office on 09/16.    Ardelle Balls, PA-C [facility] [phone]

## 2018-03-15 NOTE — Patient Instructions (Addendum)
Cleanse right lower extremity wound with Betadine swab, apply dry 4x4s with tape. Change daily and PRN. If he develops redness, increased drainage, or a fever, please call office to be seen before scheduled appointment. Dr. Tyrone SageGerhardt discussed with the son that he likely has a seroma. Hopefully, drainage will stop in time. He was instructed to finish already prescribed antibiotic today.

## 2018-03-17 ENCOUNTER — Encounter (HOSPITAL_COMMUNITY): Payer: Self-pay | Admitting: Cardiology

## 2018-03-18 ENCOUNTER — Ambulatory Visit: Payer: BLUE CROSS/BLUE SHIELD | Admitting: Cardiovascular Disease

## 2018-03-18 ENCOUNTER — Encounter: Payer: Self-pay | Admitting: Cardiovascular Disease

## 2018-03-18 VITALS — BP 118/74 | HR 63 | Ht 70.0 in | Wt 173.8 lb

## 2018-03-18 DIAGNOSIS — I2510 Atherosclerotic heart disease of native coronary artery without angina pectoris: Secondary | ICD-10-CM | POA: Diagnosis not present

## 2018-03-18 NOTE — Progress Notes (Signed)
Cardiology Office Note:    Date:  03/18/2018   ID:  Shane Sailsehal Davie, DOB 12/06/1945, MRN [id number]  PCP:  Patient, No Pcp Per  Cardiologist:  Kristeen MissPhilip Brittainy Bucker, MD   Referring MD: No ref. provider found   Chief Complaint  Patient presents with  . Coronary Artery Disease    History of Present Illness:    Shane Yates is a 72 y.o. male with a hx of coronary artery disease-status post coronary artery bypass grafting. She is seen with family members who helped interpret.  No angina .  Has normal chest soreness.  Has some shortness of breath   Was having some oozing from his SVG harvest site  CXR shows small pleural effusions, left > right  Lasix has been continued.    Past Medical History:  Diagnosis Date  . Atrial fibrillation with RVR (HCC)   . Chest pain   . Coronary artery disease   . Diabetes mellitus without complication (HCC)   . EtOH dependence (HCC)   . Hyperlipidemia   . Hypertension   . MI (myocardial infarction) [facility](HCC)     Past Surgical History:  Procedure Laterality Date  . APPENDECTOMY    . CORONARY ARTERY BYPASS GRAFT N/A 02/26/2018   Procedure: CORONARY ARTERY BYPASS GRAFTING (CABG), ON PUMP, TIMES FOUR, USING LEFT INTERNAL MAMMARY ARTERY AND ENDOSCOPICALLY HARVESTED BILATERAL GREATER SAPHENOUS VEINS;  Surgeon: Delight OvensGerhardt, Edward B, MD;  [facility];  Service: Open Heart Surgery;  Laterality: N/A;  Right SVG SEQ to Distal Circumflex and Ramus, Left SVG to PDA, LIMA to LAD  . LEFT HEART CATH AND CORONARY ANGIOGRAPHY N/A 02/23/2018   Procedure: LEFT HEART CATH AND CORONARY ANGIOGRAPHY;  Surgeon: Corky CraftsVaranasi, Jayadeep S, MD;  [facility];  Service: Cardiovascular;  Laterality: N/A;  . TEE WITHOUT CARDIOVERSION N/A 02/26/2018   Procedure: TRANSESOPHAGEAL ECHOCARDIOGRAM (TEE);  Surgeon: Delight OvensGerhardt, Edward B, MD;  [facility];  Service: Open Heart Surgery;  Laterality: N/A;    Current Medications: Current Meds  Medication Sig  . amiodarone (PACERONE) 200  MG tablet Take 1 tablet (200 mg total) by mouth daily.  Marland Kitchen. aspirin EC 325 MG EC tablet Take 1 tablet (325 mg total) by mouth daily.  Marland Kitchen. atorvastatin (LIPITOR) 20 MG tablet Take 1 tablet (20 mg total) by mouth daily at 6 PM.  . furosemide (LASIX) 20 MG tablet Take 1 tablet (20 mg total) by mouth daily.  . metFORMIN (GLUCOPHAGE-XR) 500 MG 24 hr tablet Take 1 tablet (500 mg total) by mouth daily with breakfast.  . metoprolol tartrate (LOPRESSOR) 25 MG tablet Take 0.5 tablets (12.5 mg total) by mouth 2 (two) times daily.  . Multiple Vitamin (MULTIVITAMIN WITH MINERALS) TABS tablet Take 1 tablet by mouth daily.  . naphazoline-glycerin (CLEAR EYES REDNESS) 0.012-0.2 % SOLN Place 1-2 drops into both eyes 4 (four) times daily as needed for eye irritation.  . potassium chloride (K-DUR) 10 MEQ tablet Take 1 tablet (10 mEq total) by mouth daily.  Marland Kitchen. PRESCRIPTION MEDICATION Take 100 mg by mouth daily as needed (erectile dysfunction). Vigore 100 (sildenafil citrate 100 mg) - from [place]  . traMADol (ULTRAM) 50 MG tablet Take 1 tablet (50 mg total) by mouth every 6 (six) hours as needed for moderate pain.     Allergies:   Patient has no known allergies.   Social History   Socioeconomic History  . Marital status: Married    Spouse name: Not on file  . Number of children: Not on file  .  Years of education: Not on file  . Highest education level: Not on file  Occupational History  . Not on file  Social Needs  . Financial resource strain: Not on file  . Food insecurity:    Worry: Not on file    Inability: Not on file  . Transportation needs:    Medical: Not on file    Non-medical: Not on file  Tobacco Use  . Smoking status: Never Smoker  . Smokeless tobacco: Never Used  Substance and Sexual Activity  . Alcohol use: Not on file    Comment: Per son drinks nightly about of liquor, last drink last night 02/19/18  . Drug use: Never  . Sexual activity: Not on file  Lifestyle  . Physical activity:      Days per week: Not on file    Minutes per session: Not on file  . Stress: Not on file  Relationships  . Social connections:    Talks on phone: Not on file    Gets together: Not on file    Attends religious service: Not on file    Active member of club or organization: Not on file    Attends meetings of clubs or organizations: Not on file    Relationship status: Not on file  Other Topics Concern  . Not on file  Social History Narrative  . Not on file     Family History: The patient's family history includes Hypertension in his father.  ROS:   Please see the history of present illness.     All other systems reviewed and are negative.  EKGs/Labs/Other Studies Reviewed:    The following studies were reviewed today:   EKG:     Recent Labs: 02/20/2018: TSH 0.964 02/27/2018: Magnesium 2.7 03/08/2018: ALT 40; B Natriuretic Peptide 431.9; BUN 17; Creatinine, Ser 1.14; Hemoglobin 11.7; Platelets 459; Potassium 3.7; Sodium 136  Recent Lipid Panel    Component Value Date/Time   CHOL 201 (H) 02/20/2018 1046   TRIG 135 02/20/2018 1046   HDL 56 02/20/2018 1046   CHOLHDL 3.6 02/20/2018 1046   VLDL 27 02/20/2018 1046   LDLCALC 118 (H) 02/20/2018 1046    Physical Exam:    VS:  BP 118/74   Pulse 63   Ht 5\' 10"  (1.778 m)   Wt 173 lb 12.8 oz (78.8 kg)   SpO2 96%   BMI 24.94 kg/m     Wt Readings from Last 3 Encounters:  03/18/18 173 lb 12.8 oz (78.8 kg)  03/15/18 181 lb (82.1 kg)  03/08/18 178 lb 14.4 oz (81.1 kg)     GEN: elderly male,  NAD  HEENT: Normal NECK: No JVD; No carotid bruits LYMPHATICS: No lymphadenopathy CARDIAC:  RR,   Sternal would looks good  RESPIRATORY:  Decreased breath sounds in left lung base ABDOMEN: Soft, non-tender, non-distended MUSCULOSKELETAL:  No edema; No deformity  SKIN: Warm and dry NEUROLOGIC:  Alert and oriented x 3, SVG harvest site of right leg looks good. No drainage.  PSYCHIATRIC:  Normal affect   ASSESSMENT:    1. Coronary  artery disease involving native coronary artery of native heart without angina pectoris    PLAN:    In order of problems listed above:  1. 1.  Coronary artery disease: Patient presents following coronary artery bypass grafting.  He is having the typical issues following bypass grafting.  He has lots of chest soreness.  He has a persistent left pleural effusion.  He was having some  drainage from his saphenous vein harvest site on his right leg but this is since cleared.  I think overall he is making good progress.  Dr. Tyrone Sage has him on Lasix for another week or so.  I will plan on seeing him again in 3 months.  I will see him sooner if needed.  Will also have him see Chelsea Aus, PA with me    Medication Adjustments/Labs and Tests Ordered: Current medicines are reviewed at length with the patient today.  Concerns regarding medicines are outlined above.  Orders Placed This Encounter  Procedures  . Lipid Profile  . Basic Metabolic Panel (BMET)  . Hepatic function panel   No orders of the defined types were placed in this encounter.   Patient Instructions  For your constipation  Try sugar free metamucil - 1 tsp in 8 oz of water every day  Colace if needed.   Medication Instructions:  Your physician recommends that you continue on your current medications as directed. Please refer to the Current Medication list given to you today.   Labwork: Your physician recommends that you return for lab work in: 3 months on the day of or a few days before your office visit with Dr. Elease Hashimoto.  You will need to FAST for this appointment - nothing to eat or drink after midnight the night before except water.   Testing/Procedures: None Ordered   Follow-Up: Your physician recommends that you schedule a follow-up appointment in: 3 months with Dr. Elease Hashimoto   If you need a refill on your cardiac medications before your next appointment, please call your pharmacy.   Thank you for choosing [facility]! Eligha Bridegroom, RN [phone]       Signed, Kristeen Miss, MD  03/18/2018 12:35 PM    [facility]

## 2018-03-18 NOTE — Patient Instructions (Addendum)
For your constipation  Try sugar free metamucil - 1 tsp in 8 oz of water every day  Colace if needed.   Medication Instructions:  Your physician recommends that you continue on your current medications as directed. Please refer to the Current Medication list given to you today.   Labwork: Your physician recommends that you return for lab work in: 3 months on the day of or a few days before your office visit with Dr. Elease HashimotoNahser.  You will need to FAST for this appointment - nothing to eat or drink after midnight the night before except water.   Testing/Procedures: None Ordered   Follow-Up: Your physician recommends that you schedule a follow-up appointment in: 3 months with Dr. Elease HashimotoNahser   If you need a refill on your cardiac medications before your next appointment, please call your pharmacy.   Thank you for choosing [facility]! Eligha BridegroomMichelle Swinyer, RN [phone]

## 2018-03-24 ENCOUNTER — Telehealth (HOSPITAL_COMMUNITY): Payer: Self-pay

## 2018-03-24 NOTE — Telephone Encounter (Signed)
Called and spoke with Son in regards to patient to schedule Cardiac Rehab - His son stated that patient is going to [place] in October and is unsure of the dates. He is going to talk with patient and call back. If no phone call, will follow up in a week.

## 2018-04-01 ENCOUNTER — Telehealth (HOSPITAL_COMMUNITY): Payer: Self-pay

## 2018-04-01 NOTE — Telephone Encounter (Signed)
Called and spoke with wife of patient - Wife stated patient left phone at home and will have him return phone call when he returns. If no phone call, will follow up in a week.

## 2018-04-02 ENCOUNTER — Other Ambulatory Visit: Payer: Self-pay | Admitting: Cardiothoracic Surgery

## 2018-04-02 DIAGNOSIS — Z951 Presence of aortocoronary bypass graft: Secondary | ICD-10-CM

## 2018-04-05 ENCOUNTER — Encounter: Payer: Self-pay | Admitting: Physician Assistant

## 2018-04-05 ENCOUNTER — Ambulatory Visit
Admission: RE | Admit: 2018-04-05 | Discharge: 2018-04-05 | Disposition: A | Discharge status: Home / Self Care | Payer: BLUE CROSS/BLUE SHIELD | Source: Ambulatory Visit | Attending: Cardiothoracic Surgery | Admitting: Cardiothoracic Surgery

## 2018-04-05 ENCOUNTER — Ambulatory Visit (INDEPENDENT_AMBULATORY_CARE_PROVIDER_SITE_OTHER): Payer: Self-pay | Admitting: Physician Assistant

## 2018-04-05 ENCOUNTER — Other Ambulatory Visit: Payer: Self-pay

## 2018-04-05 VITALS — BP 119/79 | HR 102 | Resp 16 | Ht 70.0 in | Wt 169.0 lb

## 2018-04-05 DIAGNOSIS — Z951 Presence of aortocoronary bypass graft: Secondary | ICD-10-CM

## 2018-04-05 DIAGNOSIS — I214 Non-ST elevation (NSTEMI) myocardial infarction: Secondary | ICD-10-CM

## 2018-04-05 DIAGNOSIS — R072 Precordial pain: Secondary | ICD-10-CM | POA: Diagnosis not present

## 2018-04-05 DIAGNOSIS — I2510 Atherosclerotic heart disease of native coronary artery without angina pectoris: Secondary | ICD-10-CM

## 2018-04-05 NOTE — Patient Instructions (Addendum)
Make every effort to keep your diabetes under very tight control.  Follow up closely with your primary care physician or endocrinologist and strive to keep their hemoglobin A1c levels as low as possible, preferably near or below 6.0.  The long term benefits of strict control of diabetes are far reaching and critically important for your overall health and survival.   You are encouraged to enroll and participate in the outpatient cardiac rehab program beginning as soon as practical.  Continue to avoid any heavy lifting or strenuous use of your arms or shoulders for at least a total of three months from the time of surgery.  After three months, you may gradually increase how much you lift or otherwise use your arms or chest as tolerated, with limits based upon whether or not activities lead to the return of significant discomfort.

## 2018-04-05 NOTE — Progress Notes (Signed)
HPI:  Patient returns for routine postoperative follow-up having undergone a CABGx 4 on 02/26/2018 by Dr. Tyrone Sage. He was previously seen on 08/26 for drainage from the right EVH wound. He was given Cephalexin previous to the appointment and was instructed to finish it and continue local wound care. It did stop draining. His son is present with him and speaks Hindu. Since hospital discharge the patient reports, he is sensitive to clothing touching his sternal incision and he is still fatigued at time.  Current Outpatient Medications  Medication Sig Dispense Refill  . amiodarone (PACERONE) 200 MG tablet Take 1 tablet (200 mg total) by mouth daily. 30 tablet 1  . aspirin EC 325 MG EC tablet Take 1 tablet (325 mg total) by mouth daily. 30 tablet 0  . atorvastatin (LIPITOR) 20 MG tablet Take 1 tablet (20 mg total) by mouth daily at 6 PM. 30 tablet 1  . furosemide (LASIX) 20 MG tablet Take 1 tablet (20 mg total) by mouth daily. 20 tablet 0  . metFORMIN (GLUCOPHAGE-XR) 500 MG 24 hr tablet Take 1 tablet (500 mg total) by mouth daily with breakfast. 30 tablet 1  . metoprolol tartrate (LOPRESSOR) 25 MG tablet Take 0.5 tablets (12.5 mg total) by mouth 2 (two) times daily. 30 tablet 1  . Multiple Vitamin (MULTIVITAMIN WITH MINERALS) TABS tablet Take 1 tablet by mouth daily.    . naphazoline-glycerin (CLEAR EYES REDNESS) 0.012-0.2 % SOLN Place 1-2 drops into both eyes 4 (four) times daily as needed for eye irritation.  0  . potassium chloride (K-DUR) 10 MEQ tablet Take 1 tablet (10 mEq total) by mouth daily. 20 tablet 0  . PRESCRIPTION MEDICATION Take 100 mg by mouth daily as needed (erectile dysfunction). Vigore 100 (sildenafil citrate 100 mg) - from [place]    . traMADol (ULTRAM) 50 MG tablet Take 1 tablet (50 mg total) by mouth every 6 (six) hours as needed for moderate pain. 28 tablet 0  Vital Signs:BP 119/79, HR 102, RR 16 Oxygenation 95% on room air  Physical Exam: CV-Tachycardic Pulmonary-Clear to  auscultation bilaterally Extremities-No LE edema, all right LE wounds are clean and dry, no drainage Wounds-Clean and dry. Remnant of suture removed from right lower extremity wound  Diagnostic Tests: CLINICAL DATA:  Post CABG.  Sternal tenderness  EXAM: CHEST - 2 VIEW  COMPARISON:  03/08/2018  FINDINGS: Pain prior median sternotomy and CABG. No pneumothorax or effusion. No confluent opacity. Heart is upper limits normal in size. No acute bony abnormality.  IMPRESSION: Prior CABG.  No acute findings.   Electronically Signed   By: Charlett Nose M.D.   On: 04/05/2018 12:56   Impression and Plan: Overall, Mr. Saleeby is continuing to recover from coronary artery bypass grafting surgery. He has already seen Dr. Elease Hashimoto at the end of August. He is to see him again in about 3 months;however, he is tachycardic at this office visit. The rate appears regular. I am going to contact cardiology's office so and EKG can be done, as he had a fib at surgery. His Lopressor may need to be increased but I will defer to cardiology. Patient instructed to continue with sternal precautions (I.e. No lifting more than 10 pounds until instructed otherwise). I told his som it would be good for the father to participate in cardiac rehab and this can be done in [facility]. We will try to arrange this to begin after he returns from his trip. His son inquired as to whether or not, patient  can fly in 2-3 weeks. As discussed with Dr. Tyrone SageGerhardt, it is ok for him to fly. Patient will return to see Dr. Tyrone SageGerhardt in November.    Ardelle Ballsonielle M Kimblery Diop, PA-C [facility] [phone]

## 2018-04-06 NOTE — Progress Notes (Signed)
Cardiology Office Note    Date:  04/07/2018   ID:  Shane Yates, DOB [birth year], MRN [id number]  PCP:  Patient, No Pcp Per  Cardiologist: Mertie Moores, MD EPS: None  No chief complaint on file.   History of Present Illness:  Shane Yates is a 72 y.o. male with history of CAD status post CABG times 4-02/26/18 by Dr. Servando Snare with postop atrial fibrillation he has had some local wound problems needing drainage from his right EVH wound.  Patient was seen in their office 04/05/2018 and was tachycardic and there was concern that he could be in atrial fibrillation so this appointment was made.  Patient comes in today accompanied by his son who is his interpreter.  He complains of dyspnea on exertion.  Does not feel his heart racing.  He is sinus tachycardia at rest at 101 bpm but not in atrial fibrillation.  States he is taking his amiodarone and metoprolol as prescribed.  Also having trouble sleeping.  No other complaints.  Edema has resolved.  Wanting to go to [place] for a month in the next week or so.  Wondering if he is able to fly or if they should drive.    Past Medical History:  Diagnosis Date  . Atrial fibrillation with RVR ([place])   . Chest pain   . Coronary artery disease   . Diabetes mellitus without complication ([place])   . EtOH dependence ([place])   . Hyperlipidemia   . Hypertension   . MI (myocardial infarction) [facility]     Past Surgical History:  Procedure Laterality Date  . APPENDECTOMY    . CORONARY ARTERY BYPASS GRAFT N/A 02/26/2018   Procedure: CORONARY ARTERY BYPASS GRAFTING (CABG), ON PUMP, TIMES FOUR, USING LEFT INTERNAL MAMMARY ARTERY AND ENDOSCOPICALLY HARVESTED BILATERAL GREATER SAPHENOUS VEINS;  Surgeon: Grace Isaac, MD;  Location: [place];  Service: Open Heart Surgery;  Laterality: N/A;  Right SVG SEQ to Distal Circumflex and Ramus, Left SVG to PDA, LIMA to LAD  . LEFT HEART CATH AND CORONARY ANGIOGRAPHY N/A 02/23/2018   Procedure: LEFT HEART CATH AND CORONARY  ANGIOGRAPHY;  Surgeon: Jettie Booze, MD;  Location: [facility] CV LAB;  Service: Cardiovascular;  Laterality: N/A;  . TEE WITHOUT CARDIOVERSION N/A 02/26/2018   Procedure: TRANSESOPHAGEAL ECHOCARDIOGRAM (TEE);  Surgeon: Grace Isaac, MD;  Location: [place];  Service: Open Heart Surgery;  Laterality: N/A;    Current Medications: Current Meds  Medication Sig  . amiodarone (PACERONE) 200 MG tablet Take 1 tablet (200 mg total) by mouth daily.  Marland Kitchen aspirin EC 325 MG EC tablet Take 1 tablet (325 mg total) by mouth daily.  Marland Kitchen atorvastatin (LIPITOR) 20 MG tablet Take 1 tablet (20 mg total) by mouth daily at 6 PM.  . metFORMIN (GLUCOPHAGE-XR) 500 MG 24 hr tablet Take 1 tablet (500 mg total) by mouth daily with breakfast.  . metoprolol tartrate (LOPRESSOR) 25 MG tablet Take 1 tablet (25 mg total) by mouth 2 (two) times daily.  . Multiple Vitamin (MULTIVITAMIN WITH MINERALS) TABS tablet Take 1 tablet by mouth daily.  . naphazoline-glycerin (CLEAR EYES REDNESS) 0.012-0.2 % SOLN Place 1-2 drops into both eyes 4 (four) times daily as needed for eye irritation.  Marland Kitchen PRESCRIPTION MEDICATION Take 100 mg by mouth daily as needed (erectile dysfunction). Vigore 100 (sildenafil citrate 100 mg) - from [place]  . traMADol (ULTRAM) 50 MG tablet Take 1 tablet (50 mg total) by mouth every 6 (six) hours as needed for moderate  pain.  . [DISCONTINUED] metoprolol tartrate (LOPRESSOR) 25 MG tablet Take 0.5 tablets (12.5 mg total) by mouth 2 (two) times daily.     Allergies:   Patient has no known allergies.   Social History   Socioeconomic History  . Marital status: Married    Spouse name: Not on file  . Number of children: Not on file  . Years of education: Not on file  . Highest education level: Not on file  Occupational History  . Not on file  Social Needs  . Financial resource strain: Not on file  . Food insecurity:    Worry: Not on file    Inability: Not on file  . Transportation needs:    Medical:  Not on file    Non-medical: Not on file  Tobacco Use  . Smoking status: Never Smoker  . Smokeless tobacco: Never Used  Substance and Sexual Activity  . Alcohol use: Not on file    Comment: Per son drinks nightly about 174m of liquor, last drink last night 02/19/18  . Drug use: Never  . Sexual activity: Not on file  Lifestyle  . Physical activity:    Days per week: Not on file    Minutes per session: Not on file  . Stress: Not on file  Relationships  . Social connections:    Talks on phone: Not on file    Gets together: Not on file    Attends religious service: Not on file    Active member of club or organization: Not on file    Attends meetings of clubs or organizations: Not on file    Relationship status: Not on file  Other Topics Concern  . Not on file  Social History Narrative  . Not on file     Family History:  The patient's   family history includes Hypertension in his father.   ROS:   Please see the history of present illness.    Review of Systems  Cardiovascular: Positive for dyspnea on exertion.  Psychiatric/Behavioral: The patient has insomnia.    All other systems reviewed and are negative.   PHYSICAL EXAM:   VS:  BP 112/70   Pulse (!) 101   Ht 5' 10" (1.778 m)   Wt 172 lb 12.8 oz (78.4 kg)   BMI 24.79 kg/m   Physical Exam  GEN: Well nourished, well developed, in no acute distress  Neck: no JVD, carotid bruits, or masses Cardiac: Incisions healing well RRR; no murmurs, rubs, or gallops  Respiratory:  clear to auscultation bilaterally, normal work of breathing GI: soft, nontender, nondistended, + BS Ext: without cyanosis, clubbing, or edema, Good distal pulses bilaterally Neuro:  Alert and Oriented x 3 Psych: euthymic mood, full affect  Wt Readings from Last 3 Encounters:  04/07/18 172 lb 12.8 oz (78.4 kg)  04/05/18 169 lb (76.7 kg)  03/18/18 173 lb 12.8 oz (78.8 kg)      Studies/Labs Reviewed:   EKG:  EKG is ordered today.  The ekg ordered  today demonstrates sinus tachycardia at 101 bpm  Recent Labs: 02/20/2018: TSH 0.964 02/27/2018: Magnesium 2.7 03/08/2018: ALT 40; B Natriuretic Peptide 431.9; BUN 17; Creatinine, Ser 1.14; Hemoglobin 11.7; Platelets 459; Potassium 3.7; Sodium 136   Lipid Panel    Component Value Date/Time   CHOL 201 (H) 02/20/2018 1046   TRIG 135 02/20/2018 1046   HDL 56 02/20/2018 1046   CHOLHDL 3.6 02/20/2018 1046   VLDL 27 02/20/2018 1046   LDLCALC 118 (H)  02/20/2018 1046    Additional studies/ records that were reviewed today include:   The echo 8/4/2019Study Conclusions   - Left ventricle: Due to varibility in heart rate EF difficult to   esitmated. The cavity size was normal. Systolic function was   mildly to moderately reduced. The estimated ejection fraction was   in the range of 40% to 45%. Wall motion was normal; there were no   regional wall motion abnormalities. - Aortic root: The aortic root was mildly dilated. - Right atrium: The atrium was mildly dilated.   TEE 8/9/2019Result status: Final result   Left atrium: Cavity is mildly dilated. No spontaneous echo contrast.  Aortic valve: The valve is trileaflet. No stenosis. No regurgitation.  Aorta: The aortic root is mildly dilated at the sinus of Valsalva. The ascending aorta is mildly dilated.  Right ventricle: Normal cavity size, wall thickness and ejection fraction. No thrombus present. No mass present.  Tricuspid valve: Mild regurgitation.     Cardiac cath 02/23/2018   Ost LM lesion is 80% stenosed.  Ramus lesion is 80% stenosed.  1st LPL lesion is 75% stenosed.  LPDA lesion is 70% stenosed.  Prox LAD to Mid LAD lesion is 80% stenosed.  Ost 1st Diag lesion is 80% stenosed.  Mid LAD lesion is 75% stenosed.  Mid RCA lesion is 100% stenosed.  The left ventricular systolic function is normal.  LV end diastolic pressure is normal.  The left ventricular ejection fraction is 55-65% by visual estimate.  There is no  aortic valve stenosis.   Holding Plavix for CVTS consult.  Continue heparin and aspirin.       ASSESSMENT:    1. Coronary artery disease involving native coronary artery of native heart without angina pectoris   2. Essential hypertension   3. Mixed hyperlipidemia   4. Tachycardia, unspecified      PLAN:  In order of problems listed above:  CAD status post CABG 02/2018 with postop atrial fibrillation and some wound problems managed by surgeons.  Overall is progressing nicely but is tachycardic at baseline.  Having some dyspnea on exertion.  May eventually do cardiac rehab but planning to go to [place] for a month.  Compression stockings for travel.  Get up and walk every 1-2 hours.  Tachycardia in their office 2 days ago and sent here for evaluation sinus tachycardia at rest at 101 bpm will increase metoprolol to 50 mg twice daily.  Continue amiodarone 200 mg once daily.  Come back next week for repeat EKG for further adjustment with Dr. Acie Fredrickson.  Check be met and BNP today.  Son requesting hemoglobin A1c as well.  Essential hypertension blood pressure well controlled on metoprolol  Mixed hyperlipidemia continue Lipitor    Medication Adjustments/Labs and Tests Ordered: Current medicines are reviewed at length with the patient today.  Concerns regarding medicines are outlined above.  Medication changes, Labs and Tests ordered today are listed in the Patient Instructions below. There are no Patient Instructions on file for this visit.   Signed, Ermalinda Barrios, PA-C  04/07/2018 [facility] [place], [place], [place]  [address] Phone: [phone]; Fax: [phone]

## 2018-04-07 ENCOUNTER — Encounter: Payer: Self-pay | Admitting: Physician Assistant

## 2018-04-07 ENCOUNTER — Ambulatory Visit: Payer: BLUE CROSS/BLUE SHIELD | Admitting: Physician Assistant

## 2018-04-07 VITALS — BP 112/70 | HR 101 | Ht 70.0 in | Wt 172.8 lb

## 2018-04-07 DIAGNOSIS — E782 Mixed hyperlipidemia: Secondary | ICD-10-CM | POA: Diagnosis not present

## 2018-04-07 DIAGNOSIS — I10 Essential (primary) hypertension: Secondary | ICD-10-CM

## 2018-04-07 DIAGNOSIS — R000 Tachycardia, unspecified: Secondary | ICD-10-CM

## 2018-04-07 DIAGNOSIS — I2510 Atherosclerotic heart disease of native coronary artery without angina pectoris: Secondary | ICD-10-CM | POA: Diagnosis not present

## 2018-04-07 MED ORDER — METOPROLOL TARTRATE 25 MG PO TABS: 25.0000 mg | ORAL_TABLET | Freq: Two times a day (BID) | ORAL | 1 refills | Status: DC

## 2018-04-07 NOTE — Patient Instructions (Addendum)
Medication Instructions:  Your physician has recommended you make the following change in your medication:   INCREASE: metoprolol tartrate 25 mg tablet: Take 1 tablet (25 mg total) twice a day  Labwork: TODAY: BMET, Pro-BNP, Hemoglobin A1C  Testing/Procedures: None ordered  Follow-Up: Your physician recommends that you schedule a follow-up appointment next week with Dr. Elease HashimotoNahser with EKG--Appointment made for 04/15/18 at 7:40 AM   Any Other Special Instructions Will Be Listed Below (If Applicable).     If you need a refill on your cardiac medications before your next appointment, please call your pharmacy.

## 2018-04-08 ENCOUNTER — Telehealth (HOSPITAL_COMMUNITY): Payer: Self-pay

## 2018-04-08 LAB — BASIC METABOLIC PANEL
BUN/Creatinine Ratio: 14 (ref 10–24)
BUN: 18 mg/dL (ref 8–27)
CALCIUM: 9.8 mg/dL (ref 8.6–10.2)
CO2: 26 mmol/L (ref 20–29)
CREATININE: 1.31 mg/dL — AB (ref 0.76–1.27)
Chloride: 98 mmol/L (ref 96–106)
GFR calc non Af Amer: 54 mL/min/{1.73_m2} — ABNORMAL LOW (ref >59)
GFR, EST AFRICAN AMERICAN: 63 mL/min/{1.73_m2} (ref >59)
Glucose: 129 mg/dL — ABNORMAL HIGH (ref 65–99)
Potassium: 4.7 mmol/L (ref 3.5–5.2)
Sodium: 141 mmol/L (ref 134–144)

## 2018-04-08 LAB — HEMOGLOBIN A1C
Est. average glucose Bld gHb Est-mCnc: 137 mg/dL
Hgb A1c MFr Bld: 6.4 % — ABNORMAL HIGH (ref 4.8–5.6)

## 2018-04-08 LAB — PRO B NATRIURETIC PEPTIDE: NT-Pro BNP: 1177 pg/mL — ABNORMAL HIGH (ref 0–376)

## 2018-04-08 NOTE — Telephone Encounter (Signed)
CORRECTED NOTE:  Called patient to see if he was interested in participating in the Cardiac Rehab Program. Patient stated yes. Patient will come in for orientation on 06/22/2018 @ 1:30PM and will attend the 6:45AM exercise class.  Mailed homework package.

## 2018-04-08 NOTE — Telephone Encounter (Signed)
Called patient to see if he was interested in participating in the Cardiac Rehab Program. Patient stated yes. Patient will come in for orientation on 06/17/2018 @ 1:30PM and will attend the 6:45AM exercise class.  Mailed homework package.

## 2018-04-09 ENCOUNTER — Telehealth: Payer: Self-pay

## 2018-04-09 MED ORDER — POTASSIUM CHLORIDE ER 20 MEQ PO TBCR: EXTENDED_RELEASE_TABLET | ORAL | 0 refills | Status: DC

## 2018-04-09 MED ORDER — FUROSEMIDE 40 MG PO TABS: ORAL_TABLET | ORAL | 0 refills | Status: DC

## 2018-04-09 NOTE — Telephone Encounter (Signed)
Called and spoke to son, who was present at OV with the patient and patient gave permission to speak with him regarding his care. Made son aware that the patient's heart failure marker was up and kidney function slightly worse than last time. Made him aware that the patient's fluid overload is likely related to his tachycardia. Made him aware that the patient will need to take lasix 40 mg QD for 3 days and K-dur 20 mEq QD x 3 days. Son states that the patient does not have any of the lasix and request Rx for both be sent in. Both sent to preferred pharmacy. Also made son aware that the patient's Hgb A1C was elevated at 6.4 which is higher than it was 1 month ago. Made him aware that the patient needs to reduce sugars in his diet. Made him aware that we will look into getting home health to check in on him a few times a week to monitor for CHF and tachycardia. He states that the patient will be going out of town soon, but will be willing to have this once he returns. Made him aware that we will work on getting this set up. Made him aware that the patient needs to keep his f/u appointment with Dr. Elease HashimotoNahser on 04/15/18 at 7:40 AM. He verbalized understanding. Instructed for him to let us know if the patient's Sx change or worsen before then. He verbalized understanding and thanked me for the call.   Will forward to [facility], Dr. Harvie BridgeNahser's RN to make her aware and see if Dr. Elease HashimotoNahser would like to sign the orders for home health.

## 2018-04-09 NOTE — Telephone Encounter (Signed)
-----   Message from Dyann KiefMichele M Lenze, PA-C sent at 04/08/2018  7:52 AM EDT ----- Heart failure marker up and kidney function up a little. Suspect he has some fluid overload because of tachycardia. He was on Lasix 20 mg after CABG. Not sure if he still has some at home but would take Lasix 40 mg dailly for 3 days Kdur 20 meq daily for 3 days and keep f/u. Hgb A1C was elevated at 6.4 higher than 1 month ago. Needs to reduce sugars in his diet. You will need to call his son as the patient doesn't speak [place]. Is there anyway to get home health to assist with him? It would be helpful to watch for CHF and tachycardia.Thanks!!

## 2018-04-12 ENCOUNTER — Ambulatory Visit: Payer: BLUE CROSS/BLUE SHIELD | Admitting: Cardiovascular Disease

## 2018-04-15 ENCOUNTER — Ambulatory Visit: Payer: BLUE CROSS/BLUE SHIELD | Admitting: Cardiovascular Disease

## 2018-04-15 ENCOUNTER — Encounter: Payer: Self-pay | Admitting: Cardiovascular Disease

## 2018-04-15 ENCOUNTER — Ambulatory Visit (INDEPENDENT_AMBULATORY_CARE_PROVIDER_SITE_OTHER): Payer: BLUE CROSS/BLUE SHIELD | Admitting: Cardiovascular Disease

## 2018-04-15 VITALS — BP 110/62 | HR 63 | Ht 70.0 in | Wt 174.0 lb

## 2018-04-15 DIAGNOSIS — R000 Tachycardia, unspecified: Secondary | ICD-10-CM | POA: Diagnosis not present

## 2018-04-15 DIAGNOSIS — I2510 Atherosclerotic heart disease of native coronary artery without angina pectoris: Secondary | ICD-10-CM | POA: Diagnosis not present

## 2018-04-15 DIAGNOSIS — I480 Paroxysmal atrial fibrillation: Secondary | ICD-10-CM

## 2018-04-15 DIAGNOSIS — Z951 Presence of aortocoronary bypass graft: Secondary | ICD-10-CM

## 2018-04-15 MED ORDER — AMIODARONE HCL 200 MG PO TABS: 200.0000 mg | ORAL_TABLET | Freq: Every day | ORAL | 0 refills | Status: DC

## 2018-04-15 MED ORDER — METOPROLOL TARTRATE 25 MG PO TABS: 25.0000 mg | ORAL_TABLET | Freq: Two times a day (BID) | ORAL | 1 refills | Status: DC

## 2018-04-15 MED ORDER — METFORMIN HCL ER 500 MG PO TB24: 500.0000 mg | ORAL_TABLET | Freq: Every day | ORAL | 0 refills | Status: DC

## 2018-04-15 MED ORDER — ATORVASTATIN CALCIUM 20 MG PO TABS: 20.0000 mg | ORAL_TABLET | Freq: Every day | ORAL | 1 refills | Status: DC

## 2018-04-15 NOTE — Progress Notes (Addendum)
Cardiology Office Note:    Date:  04/15/2018   ID:  Shane Yates, DOB Mar 27, 1946, MRN [id number]  PCP:  Patient, No Pcp Per  Cardiologist:  Kristeen Miss, MD   Referring MD: No ref. provider found   Chief Complaint  Patient presents with  . Coronary Artery Disease    History of Present Illness:    Shane Yates is a 72 y.o. male with a hx of coronary artery disease-status post coronary artery bypass grafting. She is seen with family members who helped interpret.  No angina .  Has normal chest soreness.  Has some shortness of breath   Was having some oozing from his SVG harvest site  CXR shows small pleural effusions, left > right  Lasix has been continued.   April 15, 2018: Shane Yates is seen today for follow-up visit.  He has a history of coronary artery bypass grafting August, 2019.   He had postoperative atrial fibrillation and was started on amiodarone.  He saw Shane Yates, nurse practitioner last week for sinus tachycardia.  There is no evidence of recurrent atrial fibrillation. Amiodarone was continued at 200 mg a day.  Metoprolol was increased from 12.5 mg BID to 25 mg BID ( previous note by NP says it was increased to 50 mg BID but this is an error )   Past Medical History:  Diagnosis Date  . Atrial fibrillation with RVR (HCC)   . Chest pain   . Coronary artery disease   . Diabetes mellitus without complication (HCC)   . EtOH dependence (HCC)   . Hyperlipidemia   . Hypertension   . MI (myocardial infarction) [facility])     Past Surgical History:  Procedure Laterality Date  . APPENDECTOMY    . CORONARY ARTERY BYPASS GRAFT N/A 02/26/2018   Procedure: CORONARY ARTERY BYPASS GRAFTING (CABG), ON PUMP, TIMES FOUR, USING LEFT INTERNAL MAMMARY ARTERY AND ENDOSCOPICALLY HARVESTED BILATERAL GREATER SAPHENOUS VEINS;  Surgeon: Delight Ovens, MD;  Location: [facility] OR;  Service: Open Heart Surgery;  Laterality: N/A;  Right SVG SEQ to Distal Circumflex and Ramus, Left SVG to PDA,  LIMA to LAD  . LEFT HEART CATH AND CORONARY ANGIOGRAPHY N/A 02/23/2018   Procedure: LEFT HEART CATH AND CORONARY ANGIOGRAPHY;  Surgeon: Corky Crafts, MD;  Location[facility] INVASIVE CV LAB;  Service: Cardiovascular;  Laterality: N/A;  . TEE WITHOUT CARDIOVERSION N/A 02/26/2018   Procedure: TRANSESOPHAGEAL ECHOCARDIOGRAM (TEE);  Surgeon: Delight Ovens, MD;  Location: [facility] OR;  Service: Open Heart Surgery;  Laterality: N/A;    Current Medications: Current Meds  Medication Sig  . amiodarone (PACERONE) 200 MG tablet Take 1 tablet (200 mg total) by mouth daily.  Marland Kitchen aspirin EC 325 MG EC tablet Take 1 tablet (325 mg total) by mouth daily.  Marland Kitchen atorvastatin (LIPITOR) 20 MG tablet Take 1 tablet (20 mg total) by mouth daily at 6 PM.  . metFORMIN (GLUCOPHAGE-XR) 500 MG 24 hr tablet Take 1 tablet (500 mg total) by mouth daily with breakfast.  . metoprolol tartrate (LOPRESSOR) 25 MG tablet Take 1 tablet (25 mg total) by mouth 2 (two) times daily.  . Multiple Vitamin (MULTIVITAMIN WITH MINERALS) TABS tablet Take 1 tablet by mouth daily.  . naphazoline-glycerin (CLEAR EYES REDNESS) 0.012-0.2 % SOLN Place 1-2 drops into both eyes 4 (four) times daily as needed for eye irritation.  Marland Kitchen PRESCRIPTION MEDICATION Take 100 mg by mouth daily as needed (erectile dysfunction). Vigore 100 (sildenafil citrate 100 mg) - from [place]  .  traMADol (ULTRAM) 50 MG tablet Take 1 tablet (50 mg total) by mouth every 6 (six) hours as needed for moderate pain.  . [DISCONTINUED] amiodarone (PACERONE) 200 MG tablet Take 1 tablet (200 mg total) by mouth daily.  . [DISCONTINUED] atorvastatin (LIPITOR) 20 MG tablet Take 1 tablet (20 mg total) by mouth daily at 6 PM.  . [DISCONTINUED] metoprolol tartrate (LOPRESSOR) 25 MG tablet Take 1 tablet (25 mg total) by mouth 2 (two) times daily.     Allergies:   Patient has no known allergies.   Social History   Socioeconomic History  . Marital status: Married    Spouse name: Not on file  .  Number of children: Not on file  . Years of education: Not on file  . Highest education level: Not on file  Occupational History  . Not on file  Social Needs  . Financial resource strain: Not on file  . Food insecurity:    Worry: Not on file    Inability: Not on file  . Transportation needs:    Medical: Not on file    Non-medical: Not on file  Tobacco Use  . Smoking status: Never Smoker  . Smokeless tobacco: Never Used  Substance and Sexual Activity  . Alcohol use: Not on file    Comment: Per son drinks nightly about of liquor, last drink last night 02/19/18  . Drug use: Never  . Sexual activity: Not on file  Lifestyle  . Physical activity:    Days per week: Not on file    Minutes per session: Not on file  . Stress: Not on file  Relationships  . Social connections:    Talks on phone: Not on file    Gets together: Not on file    Attends religious service: Not on file    Active member of club or organization: Not on file    Attends meetings of clubs or organizations: Not on file    Relationship status: Not on file  Other Topics Concern  . Not on file  Social History Narrative  . Not on file     Family History: The patient's family history includes Hypertension in his father.  ROS:   Please see the history of present illness.     All other systems reviewed and are negative.  EKGs/Labs/Other Studies Reviewed:    The following studies were reviewed today:     Recent Labs: 02/20/2018: TSH 0.964 02/27/2018: Magnesium 2.7 03/08/2018: ALT 40; B Natriuretic Peptide 431.9; Hemoglobin 11.7; Platelets 459 04/07/2018: BUN 18; Creatinine, Ser 1.31; NT-Pro BNP 1,177; Potassium 4.7; Sodium 141  Recent Lipid Panel    Component Value Date/Time   CHOL 201 (H) 02/20/2018 1046   TRIG 135 02/20/2018 1046   HDL 56 02/20/2018 1046   CHOLHDL 3.6 02/20/2018 1046   VLDL 27 02/20/2018 1046   LDLCALC 118 (H) 02/20/2018 1046   Physical Exam: Blood pressure 110/62, pulse 63,  height 5\' 10"  (1.778 m), weight 174 lb (78.9 kg), SpO2 94 %.  GEN:  Well nourished, well developed in no acute distress HEENT: Normal NECK: No JVD; No carotid bruits LYMPHATICS: No lymphadenopathy CARDIAC: RRR , well healed sternotomy scare RESPIRATORY:  Clear to auscultation without rales, wheezing or rhonchi  ABDOMEN: Soft, non-tender, non-distended MUSCULOSKELETAL:  Small suture from SVG harvest site  SKIN: Warm and dry NEUROLOGIC:  Alert and oriented x 3  EKG:     Sept 26, 2019 NSR at 79.   Normal ECG  ASSESSMENT:    1. PAF (paroxysmal atrial fibrillation) (HCC)   2. Coronary artery disease involving native coronary artery of native heart without angina pectoris   3. Tachycardia, unspecified   4. S/P CABG x 4    PLAN:    In order of problems listed above:  1. 1.  Coronary artery disease:   Seems to be doing well.  Is not having any angina.  He is having the usual amount of chest soreness.  He is also having some soreness in his leg.  He had a small suture coming from the saphenous vein harvest site of his leg.  The suture with was bathed with Betadine and was pulled from the skin.  No longer on scheduled Lasix.  He should keep the Lasix on hand and can take it if he becomes volume overloaded.   2.  Postoperative atrial fibrillation: Patient remains on amiodarone 200 mg a day.  He is on metoprolol 25 mg twice a day.  We will continue the amiodarone for 2 more months and then will discontinue it. Continue metoprolol at present dose.  We will see him in 2 to 3 months and anticipate discontinuing amiodarone at that time.      Medication Adjustments/Labs and Tests Ordered: Current medicines are reviewed at length with the patient today.  Concerns regarding medicines are outlined above.  Orders Placed This Encounter  Procedures  . EKG 12-Lead   Meds ordered this encounter  Medications  . amiodarone (PACERONE) 200 MG tablet    Sig: Take 1 tablet (200 mg total) by mouth  daily.    Dispense:  60 tablet    Refill:  0  . atorvastatin (LIPITOR) 20 MG tablet    Sig: Take 1 tablet (20 mg total) by mouth daily at 6 PM.    Dispense:  90 tablet    Refill:  1  . metoprolol tartrate (LOPRESSOR) 25 MG tablet    Sig: Take 1 tablet (25 mg total) by mouth 2 (two) times daily.    Dispense:  180 tablet    Refill:  1    Dose increase    Patient Instructions  Medication Instructions:  Your physician recommends that you continue on your current medications as directed. Please refer to the Current Medication list given to you today. **Refills have been sent to your pharmacy You may take Lasix (Furosemide) 40 mg daily and KDur (Potassium) 20 mEq daily for swelling/weight gain    Labwork: None Ordered   Testing/Procedures: None Ordered   Follow-Up: Your physician recommends that you schedule a follow-up appointment in: 2-3 months with Dr. Elease Hashimoto or Chelsea Aus, PA   If you need a refill on your cardiac medications before your next appointment, please call your pharmacy.   Thank you for choosing [facility]! Eligha Bridegroom, RN [phone]       Signed, Kristeen Miss, MD  04/15/2018 8:30 AM    [facility]

## 2018-04-15 NOTE — Addendum Note (Signed)
Addended by: Levi Aland on: 04/15/2018 08:37 AM   Modules accepted: Orders

## 2018-04-15 NOTE — Patient Instructions (Addendum)
Medication Instructions:  Your physician recommends that you continue on your current medications as directed. Please refer to the Current Medication list given to you today. **Refills have been sent to your pharmacy You may take Lasix (Furosemide) 40 mg daily and KDur (Potassium) 20 mEq daily for swelling/weight gain    Labwork: None Ordered   Testing/Procedures: None Ordered   Follow-Up: Your physician recommends that you schedule a follow-up appointment in: 2-3 months with Dr. Elease Hashimoto or Chelsea Aus, PA   If you need a refill on your cardiac medications before your next appointment, please call your pharmacy.   Thank you for choosing [facility]! Eligha Bridegroom, RN [phone]

## 2018-04-22 ENCOUNTER — Telehealth: Payer: Self-pay

## 2018-04-22 ENCOUNTER — Telehealth: Payer: Self-pay | Admitting: Cardiovascular Disease

## 2018-04-22 NOTE — Telephone Encounter (Signed)
°  Per patients son call following up on wether the medication  metoprolo tartrate  25mg  is all that needed to be refilled per the last office visit son is not sure.  Please give him a call back. He has questions since pt will be going out of town for 2 months.

## 2018-04-22 NOTE — Telephone Encounter (Signed)
Spoke with pt's son who states he need refills on amiodarone, metoprolol, metformin, and atorvastatin. I checked, and it looked like all those medications had been sent in during pt's last office visit on 9/26. Pt states [facility] would no dispense these for her. I called the pharmacy, scripts are on hold because of insurance holds.  I called pt's son back and explained how it was an insurance hold. I advised him to call [employer] to follow up as pt is going out of town for a month and will need additional stock of medication. [facility] also stated they would try and work with [employer] to have medications covered. Pt's son has verbalized understanding and had no additional questions.

## 2018-04-22 NOTE — Telephone Encounter (Signed)
Patient's son contacted the office asking if there was a need for a refill of his father's medications because the patient was going out of the country for a couple of months.  After review of what medications the patient was taking and what he is taking them for his son understood which medications needed refilling.  I advised him to contact his Cardiologist, Dr/ Nahser's office to get those refills.  He acknowledged receipt.

## 2018-04-28 ENCOUNTER — Other Ambulatory Visit: Payer: Self-pay | Admitting: Physician Assistant

## 2018-05-03 ENCOUNTER — Other Ambulatory Visit: Payer: Self-pay | Admitting: Physician Assistant

## 2018-05-14 ENCOUNTER — Other Ambulatory Visit: Payer: Self-pay | Admitting: Cardiovascular Disease

## 2018-06-07 ENCOUNTER — Other Ambulatory Visit: Payer: BLUE CROSS/BLUE SHIELD

## 2018-06-09 ENCOUNTER — Ambulatory Visit: Payer: BLUE CROSS/BLUE SHIELD | Admitting: Cardiovascular Disease

## 2018-06-15 ENCOUNTER — Telehealth (HOSPITAL_COMMUNITY): Payer: Self-pay

## 2018-06-15 NOTE — Progress Notes (Signed)
Shane Yates 72 y.o. male DOB 12/12/1945 MRN [id number]       Nutrition  No diagnosis found. Past Medical History:  Diagnosis Date  . Atrial fibrillation with RVR (HCC)   . Chest pain   . Coronary artery disease   . Diabetes mellitus without complication (HCC)   . EtOH dependence (HCC)   . Hyperlipidemia   . Hypertension   . MI (myocardial infarction) (HCC)    Meds reviewed.    Current Outpatient Medications (Endocrine & Metabolic):  .  metFORMIN (GLUCOPHAGE-XR) 500 MG 24 hr tablet, TAKE 1 TABLET BY MOUTH EVERY DAY WITH BREAKFAST  Current Outpatient Medications (Cardiovascular):  .  amiodarone (PACERONE) 200 MG tablet, TAKE 1 TABLET BY MOUTH EVERY DAY .  atorvastatin (LIPITOR) 20 MG tablet, Take 1 tablet (20 mg total) by mouth daily at 6 PM. .  metoprolol tartrate (LOPRESSOR) 25 MG tablet, Take 1 tablet (25 mg total) by mouth 2 (two) times daily.   Current Outpatient Medications (Analgesics):  .  aspirin EC 325 MG EC tablet, Take 1 tablet (325 mg total) by mouth daily. .  traMADol (ULTRAM) 50 MG tablet, Take 1 tablet (50 mg total) by mouth every 6 (six) hours as needed for moderate pain.   Current Outpatient Medications (Other):  Marland Kitchen.  Multiple Vitamin (MULTIVITAMIN WITH MINERALS) TABS tablet, Take 1 tablet by mouth daily. .  naphazoline-glycerin (CLEAR EYES REDNESS) 0.012-0.2 % SOLN, Place 1-2 drops into both eyes 4 (four) times daily as needed for eye irritation. Marland Kitchen.  PRESCRIPTION MEDICATION, Take 100 mg by mouth daily as needed (erectile dysfunction). Vigore 100 (sildenafil citrate 100 mg) - from [place]   HT: Ht Readings from Last 1 Encounters:  04/15/18 5\' 10"  (1.778 m)    WT: Wt Readings from Last 5 Encounters:  04/15/18 174 lb (78.9 kg)  04/07/18 172 lb 12.8 oz (78.4 kg)  04/05/18 169 lb (76.7 kg)  03/18/18 173 lb 12.8 oz (78.8 kg)  03/15/18 181 lb (82.1 kg)     BMI = 24.97   04/15/18  Current tobacco use? No       Labs:  Lipid Panel     Component Value  Date/Time   CHOL 201 (H) 02/20/2018 1046   TRIG 135 02/20/2018 1046   HDL 56 02/20/2018 1046   CHOLHDL 3.6 02/20/2018 1046   VLDL 27 02/20/2018 1046   LDLCALC 118 (H) 02/20/2018 1046    Lab Results  Component Value Date   HGBA1C 6.4 (H) 04/07/2018   CBG (last 3)  No results for input(s): GLUCAP in the last 72 hours.  Nutrition Diagnosis ? Food-and nutrition-related knowledge deficit related to lack of exposure to information as related to diagnosis of: ? CVD ? Type 2 Diabetes  Nutrition Goal(s):  ? To be determined  Plan:      Pt to attend nutrition classes ? Nutrition I ? Nutrition II ? Portion Distortion  ? Diabetes Blitz ? Diabetes Q & A Will provide client-centered nutrition education as part of interdisciplinary care.   Monitor and evaluate progress toward nutrition goal with team.  Ross MarcusAubrey Burklin, MS, RD, LDN 06/15/2018 6:09 PM

## 2018-06-21 ENCOUNTER — Other Ambulatory Visit: Payer: BLUE CROSS/BLUE SHIELD | Admitting: *Deleted

## 2018-06-21 DIAGNOSIS — I2510 Atherosclerotic heart disease of native coronary artery without angina pectoris: Secondary | ICD-10-CM | POA: Diagnosis not present

## 2018-06-21 LAB — LIPID PANEL
Chol/HDL Ratio: 2.6 ratio (ref 0.0–5.0)
Cholesterol, Total: 125 mg/dL (ref 100–199)
HDL: 48 mg/dL (ref >39)
LDL Calculated: 59 mg/dL (ref 0–99)
Triglycerides: 91 mg/dL (ref 0–149)
VLDL CHOLESTEROL CAL: 18 mg/dL (ref 5–40)

## 2018-06-21 LAB — BASIC METABOLIC PANEL
BUN/Creatinine Ratio: 12 (ref 10–24)
BUN: 16 mg/dL (ref 8–27)
CALCIUM: 9.4 mg/dL (ref 8.6–10.2)
CO2: 24 mmol/L (ref 20–29)
CREATININE: 1.3 mg/dL — AB (ref 0.76–1.27)
Chloride: 101 mmol/L (ref 96–106)
GFR calc Af Amer: 63 mL/min/{1.73_m2} (ref >59)
GFR calc non Af Amer: 55 mL/min/{1.73_m2} — ABNORMAL LOW (ref >59)
GLUCOSE: 109 mg/dL — AB (ref 65–99)
Potassium: 4.2 mmol/L (ref 3.5–5.2)
SODIUM: 142 mmol/L (ref 134–144)

## 2018-06-21 LAB — HEPATIC FUNCTION PANEL
ALBUMIN: 4.3 g/dL (ref 3.5–4.8)
ALK PHOS: 82 IU/L (ref 39–117)
ALT: 12 IU/L (ref 0–44)
AST: 16 IU/L (ref 0–40)
Bilirubin Total: 1.2 mg/dL (ref 0.0–1.2)
Bilirubin, Direct: 0.37 mg/dL (ref 0.00–0.40)
TOTAL PROTEIN: 6.5 g/dL (ref 6.0–8.5)

## 2018-06-21 NOTE — Telephone Encounter (Signed)
Cardiac Rehab - Pharmacy Resident Documentation   Reached Mr. Thedore MinsSingh this afternoon. He said that he would not be able to make his appointment tomorrow (06/22/18) due to being out of town. Will contact cardiac rehab nurses to reschedule.  Arvilla MarketMelissa Lore, PharmD PGY1 Pharmacy Resident Phone [phone] 06/21/2018     2:46 PM

## 2018-06-21 NOTE — Progress Notes (Signed)
Cardiology Office Note:    Date:  06/22/2018   ID:  Shane Yates, DOB 06/29/1946, MRN [id number]  PCP:  Patient, No Pcp Per  Cardiologist:  Kristeen MissPhilip Nahser, MD  Electrophysiologist:  None   Referring MD: No ref. provider found   Chief Complaint  Patient presents with  . Follow-up    CAD and atrial fibrillation    History of Present Illness:    Shane Yates is a 72 y.o. male with coronary artery disease s/p coronary artery bypass grafting in 02/2018 (presented with a NSTEMI) complicated by postoperative atrial fibrillation, diabetes, hypertension, hyperlipidemia.  He was last seen by Dr. Elease HashimotoNahser 04/15/18 and was noted to be doing well without anginal complaints. He was continued on amiodarone 200mg  daily for post-op afib and was recommended to continue for 2 more months.  He presents today for follow-up of his CAD and post-op atrial fibrillation. He is here with his son who assists with interpretation. He has been doing well since his last visit with Dr. Elease HashimotoNahser 03/2018. No complaints of chest pain, SOB, DOE, LE edema, orthopnea, PND, palpitations, dizziness, lightheadedness, or syncope. He is leaving for [place] tomorrow for a 3-6 month visit. Son reports he will be able to follow-up with a medical provider in [place] if needed. Son asks for medication prescriptions to cover him during his stay. Discussed importance of maintaining a low salt diet. Also discussed symptoms in which he should seek medical attention should they occur.    Prior CV studies:   The following studies were reviewed today:  Cardiac Catheterization 02/23/18  Ost LM lesion is 80% stenosed.  Ramus lesion is 80% stenosed.  1st LPL lesion is 75% stenosed.  LPDA lesion is 70% stenosed.  Prox LAD to Mid LAD lesion is 80% stenosed.  Ost 1st Diag lesion is 80% stenosed.  Mid LAD lesion is 75% stenosed.  Mid RCA lesion is 100% stenosed.  The left ventricular systolic function is normal.  LV end diastolic pressure is  normal.  The left ventricular ejection fraction is 55-65% by visual estimate.  There is no aortic valve stenosis.   Holding Plavix for CVTS consult.  Continue heparin and aspirin.    Echo 02/21/18 Study Conclusions  - Left ventricle: Due to varibility in heart rate EF difficult to   esitmated. The cavity size was normal. Systolic function was   mildly to moderately reduced. The estimated ejection fraction was   in the range of 40% to 45%. Wall motion was normal; there were no   regional wall motion abnormalities. - Aortic root: The aortic root was mildly dilated. - Right atrium: The atrium was mildly dilated.     Past Medical History:  Diagnosis Date  . Atrial fibrillation with RVR (HCC)   . Chest pain   . Coronary artery disease   . Diabetes mellitus without complication (HCC)   . EtOH dependence (HCC)   . Hyperlipidemia   . Hypertension   . MI (myocardial infarction) [facility](HCC)    Surgical Hx: The patient  has a past surgical history that includes Appendectomy; LEFT HEART CATH AND CORONARY ANGIOGRAPHY (N/A, 02/23/2018); Coronary artery bypass graft (N/A, 02/26/2018); and TEE without cardioversion (N/A, 02/26/2018).   Current Medications: Current Meds  Medication Sig  . atorvastatin (LIPITOR) 20 MG tablet Take 1 tablet (20 mg total) by mouth daily at 6 PM.  . metFORMIN (GLUCOPHAGE-XR) 500 MG 24 hr tablet TAKE 1 TABLET BY MOUTH EVERY DAY WITH BREAKFAST  . metoprolol tartrate (LOPRESSOR) 25 MG  tablet Take 1 tablet (25 mg total) by mouth 2 (two) times daily.  . Multiple Vitamin (MULTIVITAMIN WITH MINERALS) TABS tablet Take 1 tablet by mouth daily.  . naphazoline-glycerin (CLEAR EYES REDNESS) 0.012-0.2 % SOLN Place 1-2 drops into both eyes 4 (four) times daily as needed for eye irritation.  . traMADol (ULTRAM) 50 MG tablet Take 1 tablet (50 mg total) by mouth every 6 (six) hours as needed for moderate pain.  . [DISCONTINUED] amiodarone (PACERONE) 200 MG tablet TAKE 1 TABLET BY MOUTH  EVERY DAY  . [DISCONTINUED] aspirin EC 325 MG EC tablet Take 1 tablet (325 mg total) by mouth daily.  . [DISCONTINUED] atorvastatin (LIPITOR) 20 MG tablet Take 1 tablet (20 mg total) by mouth daily at 6 PM.  . [DISCONTINUED] atorvastatin (LIPITOR) 20 MG tablet Take 1 tablet (20 mg total) by mouth daily at 6 PM.  . [DISCONTINUED] metFORMIN (GLUCOPHAGE-XR) 500 MG 24 hr tablet TAKE 1 TABLET BY MOUTH EVERY DAY WITH BREAKFAST  . [DISCONTINUED] metFORMIN (GLUCOPHAGE-XR) 500 MG 24 hr tablet TAKE 1 TABLET BY MOUTH EVERY DAY WITH BREAKFAST  . [DISCONTINUED] metoprolol tartrate (LOPRESSOR) 25 MG tablet Take 1 tablet (25 mg total) by mouth 2 (two) times daily.  . [DISCONTINUED] metoprolol tartrate (LOPRESSOR) 25 MG tablet Take 1 tablet (25 mg total) by mouth 2 (two) times daily.     Allergies:   Patient has no known allergies.   Social History   Tobacco Use  . Smoking status: Never Smoker  . Smokeless tobacco: Never Used  Substance Use Topics  . Alcohol use: Not on file    Comment: Per son drinks nightly about of liquor, last drink last night 02/19/18  . Drug use: Never     Family Hx: The patient's family history includes Hypertension in his father.  ROS:   Please see the history of present illness.    ROS All other systems reviewed and are negative.   EKGs/Labs/Other Test Reviewed:    EKG:  EKG is ordered today.  The ekg ordered today demonstrates sinus bradycardia with baseline wander; non-ischemic  Recent Labs: 02/20/2018: TSH 0.964 02/27/2018: Magnesium 2.7 03/08/2018: B Natriuretic Peptide 431.9; Hemoglobin 11.7; Platelets 459 04/07/2018: NT-Pro BNP 1,177 06/21/2018: ALT 12; BUN 16; Creatinine, Ser 1.30; Potassium 4.2; Sodium 142   Recent Lipid Panel Lab Results  Component Value Date/Time   CHOL 125 06/21/2018 07:39 AM   TRIG 91 06/21/2018 07:39 AM   HDL 48 06/21/2018 07:39 AM   CHOLHDL 2.6 06/21/2018 07:39 AM   CHOLHDL 3.6 02/20/2018 10:46 AM   LDLCALC 59 06/21/2018 07:39  AM    Physical Exam:    VS:  BP (!) 148/80   Pulse (!) 50   Ht 5\' 10"  (1.778 m)   Wt 171 lb 12.8 oz (77.9 kg)   SpO2 98%   BMI 24.65 kg/m     Wt Readings from Last 3 Encounters:  06/22/18 171 lb 12.8 oz (77.9 kg)  04/15/18 174 lb (78.9 kg)  04/07/18 172 lb 12.8 oz (78.4 kg)      GEN: sitting upright on exam table in no acute distress.   Neck: No JVD, no carotid bruits Cardiac: RRR, no murmurs, rubs, or gallops.  Respiratory: Clear to auscultation bilaterally, no wheezes/ rales/ rhonchi GI: NABS, Soft, nontender, non-distended  MS: No edema; No deformity. Neuro:  Nonfocal, moving all extremities spontaneously Psych: Normal affect      ASSESSMENT & PLAN:    1. CAD s/p CABG 02/2018: Doing well  over the last 2 months without anginal complaints.  - Will decrease aspirin to 81mg  daily now that he is 3 months post-CABG - Continue statin and metoprolol  2. Post-op atrial fibrillation: no recurrence with amiodarone. Sinus bradycardia on EKG today - Will stop amiodarone today - Continue metoprolol 25mg  BID - If atrial fibrillation reoccurs, will need anticoagulation   3. HTN: BP up today but overall has been well controlled on recent visits. Educated patient on maintaining a low sodium diet.  - Continue metoprolol 25mg  BID  4. HLD: LDL 59 06/21/18; at goal of <70 - Continue atorvastatin  5. DM type 2: A1C 6.4 03/2018; at goal of <7 - Continue metformin 500mg  daily - recommend follow-up with PCP in the future for ongoing management  5. CKD stage 3: Cr stable at 1.3 on labs yesterday.  - Continue routine monitoring  Recommend follow-up with Dr. Elease Hashimoto in 4-6 months or upon return from [place].    Medication Adjustments/Labs and Tests Ordered: Current medicines are reviewed at length with the patient today.  Concerns regarding medicines are outlined above.   Tests Ordered: Orders Placed This Encounter  Procedures  . EKG 12-Lead   Medication Changes: Meds ordered this  encounter  Medications  . aspirin EC 81 MG tablet    Sig: Take 1 tablet (81 mg total) by mouth daily.    Dispense:  90 tablet    Refill:  3  . DISCONTD: atorvastatin (LIPITOR) 20 MG tablet    Sig: Take 1 tablet (20 mg total) by mouth daily at 6 PM.    Dispense:  90 tablet    Refill:  3  . DISCONTD: metFORMIN (GLUCOPHAGE-XR) 500 MG 24 hr tablet    Sig: TAKE 1 TABLET BY MOUTH EVERY DAY WITH BREAKFAST    Dispense:  30 tablet    Refill:  11  . DISCONTD: metoprolol tartrate (LOPRESSOR) 25 MG tablet    Sig: Take 1 tablet (25 mg total) by mouth 2 (two) times daily.    Dispense:  180 tablet    Refill:  3    Dose increase  . atorvastatin (LIPITOR) 20 MG tablet    Sig: Take 1 tablet (20 mg total) by mouth daily at 6 PM.    Dispense:  90 tablet    Refill:  3  . metFORMIN (GLUCOPHAGE-XR) 500 MG 24 hr tablet    Sig: TAKE 1 TABLET BY MOUTH EVERY DAY WITH BREAKFAST    Dispense:  30 tablet    Refill:  11  . metoprolol tartrate (LOPRESSOR) 25 MG tablet    Sig: Take 1 tablet (25 mg total) by mouth 2 (two) times daily.    Dispense:  180 tablet    Refill:  3    Dose increase    Signed, Beatriz Stallion, PA-C  06/22/2018 1:16 PM    [facility] [address] [place], [place], [place]  [address] Phone: [phone]; Fax: [phone]

## 2018-06-22 ENCOUNTER — Ambulatory Visit: Payer: BLUE CROSS/BLUE SHIELD | Admitting: Medical

## 2018-06-22 ENCOUNTER — Encounter: Payer: Self-pay | Admitting: Physician Assistant

## 2018-06-22 ENCOUNTER — Inpatient Hospital Stay (HOSPITAL_COMMUNITY)
Admission: RE | Admit: 2018-06-22 | Discharge: 2018-06-22 | Disposition: A | Discharge status: Home / Self Care | Payer: BLUE CROSS/BLUE SHIELD | Source: Ambulatory Visit

## 2018-06-22 VITALS — BP 148/80 | HR 50 | Ht 70.0 in | Wt 171.8 lb

## 2018-06-22 DIAGNOSIS — I10 Essential (primary) hypertension: Secondary | ICD-10-CM

## 2018-06-22 DIAGNOSIS — I9789 Other postprocedural complications and disorders of the circulatory system, not elsewhere classified: Secondary | ICD-10-CM

## 2018-06-22 DIAGNOSIS — E782 Mixed hyperlipidemia: Secondary | ICD-10-CM

## 2018-06-22 DIAGNOSIS — I2510 Atherosclerotic heart disease of native coronary artery without angina pectoris: Secondary | ICD-10-CM | POA: Diagnosis not present

## 2018-06-22 DIAGNOSIS — E119 Type 2 diabetes mellitus without complications: Secondary | ICD-10-CM

## 2018-06-22 DIAGNOSIS — I4891 Unspecified atrial fibrillation: Secondary | ICD-10-CM

## 2018-06-22 MED ORDER — METOPROLOL TARTRATE 25 MG PO TABS: 25.0000 mg | ORAL_TABLET | Freq: Two times a day (BID) | ORAL | 3 refills | Status: DC

## 2018-06-22 MED ORDER — ATORVASTATIN CALCIUM 20 MG PO TABS: 20.0000 mg | ORAL_TABLET | Freq: Every day | ORAL | 3 refills | Status: DC

## 2018-06-22 MED ORDER — METFORMIN HCL ER 500 MG PO TB24: ORAL_TABLET | ORAL | 11 refills | Status: DC

## 2018-06-22 MED ORDER — ASPIRIN EC 81 MG PO TBEC: 81.0000 mg | DELAYED_RELEASE_TABLET | Freq: Every day | ORAL | 3 refills | Status: AC

## 2018-06-22 MED FILL — METFORMIN HCL ER 500 MG TAB: 500 | 30 days supply | Qty: 30 | Fill #0

## 2018-06-22 MED FILL — ATORVASTATIN 20 MG TABLET: 20 | 30 days supply | Qty: 30 | Fill #0

## 2018-06-22 MED FILL — METOPROLOL TARTRATE 25 MG T: 25 | 30 days supply | Qty: 60 | Fill #0

## 2018-06-22 NOTE — Patient Instructions (Signed)
Medication Instructions:  Your physician has recommended you make the following change in your medication:  1. STOP AMIODARONE  2. DECREASE ASPIRIN 325 MG TO 81 MG DAILY.   If you need a refill on your cardiac medications before your next appointment, please call your pharmacy.   Lab work: NONE If you have labs (blood work) drawn today and your tests are completely normal, you will receive your results only by: Marland Kitchen. MyChart Message (if you have MyChart) OR . A paper copy in the mail If you have any lab test that is abnormal or we need to change your treatment, we will call you to review the results.  Testing/Procedures: NONE  Follow-Up: At [facility], you and your health needs are our priority.  As part of our continuing mission to provide you with exceptional heart care, we have created designated Provider Care Teams.  These Care Teams include your primary Cardiologist (physician) and Advanced Practice Providers (APPs -  Physician Assistants and Nurse Practitioners) who all work together to provide you with the care you need, when you need it. You will need a follow up appointment in:  6 months.  Please call our office 2 months in advance to schedule this appointment.  You may see Kristeen MissPhilip Nahser, MD or one of the following Advanced Practice Providers on your designated Care Team: Tereso NewcomerScott Weaver, PA-C Vin Nora SpringsBhagat, [place] JerseyPA-C . Berton BonJanine Hammond, NP  Any Other Special Instructions Will Be Listed Below (If Applicable).

## 2018-06-23 MED FILL — METOPROLOL TARTRATE 25 MG T: 25 | 90 days supply | Qty: 180 | Fill #0

## 2018-06-24 ENCOUNTER — Ambulatory Visit: Payer: BLUE CROSS/BLUE SHIELD | Admitting: Cardiothoracic Surgery

## 2018-06-24 ENCOUNTER — Other Ambulatory Visit: Payer: Self-pay

## 2018-06-24 ENCOUNTER — Encounter: Payer: Self-pay | Admitting: Cardiothoracic Surgery

## 2018-06-24 VITALS — BP 148/80 | HR 50 | Resp 16 | Ht 70.0 in | Wt 173.4 lb

## 2018-06-24 DIAGNOSIS — I2510 Atherosclerotic heart disease of native coronary artery without angina pectoris: Secondary | ICD-10-CM

## 2018-06-24 DIAGNOSIS — Z951 Presence of aortocoronary bypass graft: Secondary | ICD-10-CM

## 2018-06-24 DIAGNOSIS — I214 Non-ST elevation (NSTEMI) myocardial infarction: Secondary | ICD-10-CM | POA: Diagnosis not present

## 2018-06-27 NOTE — Progress Notes (Signed)
[address]       [place] [id number]             [phone]      Lollie Sailsehal [facility]obler [facility] Medical Record #[id number] Date of Birth: 02/19/1946  Referring: Nahser, Deloris PingPhilip J, MD Primary Care: Patient, No Pcp Per Primary Cardiologist: Kristeen MissPhilip Nahser, MD   Chief Complaint:   POST OP FOLLOW UP DATE OF PROCEDURE:  02/26/2018 PREOPERATIVE DIAGNOSIS:  Three-vessel coronary artery disease with left main obstruction, Prop NONST MI  POSTOPERATIVE DIAGNOSIS: same  SURGICAL PROCEDURE:  Coronary artery bypass grafting x4 with the left internal mammary to the left anterior descending coronary artery, reverse saphenous vein graft sequentially to the intermediate and distal circumflex, reverse saphenous vein graft to  the posterior descending, which arises from the distal circumflex, bilateral greater saphenous endoscopic vein harvesting.  SURGEON:  Sheliah PlaneEdward Rhylen Pulido, MD.  History of Present Illness:     Patient returns to the office today in follow-up after coronary artery bypass grafting in August 2019.  The patient has been doing well increasing his physical activity appropriately, he has had no recurrent symptoms of angina or congestive heart failure.  He plans to return to [place] for an extended visit.  He is in the office today with his son who notes that he is been more diligent about caring for his diabetes since discharge.     Past Medical History:  Diagnosis Date  . Atrial fibrillation with RVR (HCC)   . Chest pain   . Coronary artery disease   . Diabetes mellitus without complication (HCC)   . EtOH dependence (HCC)   . Hyperlipidemia   . Hypertension   . MI (myocardial infarction) (HCC)      Social History   Tobacco Use  Smoking Status Never Smoker  Smokeless Tobacco Never Used    Social History   Substance and Sexual Activity  Alcohol Use Not on file   Comment: Per son drinks nightly about 100ml of liquor, last drink last night 02/19/18     No  Known Allergies  Current Outpatient Medications  Medication Sig Dispense Refill  . aspirin EC 81 MG tablet Take 1 tablet (81 mg total) by mouth daily. 90 tablet 3  . atorvastatin (LIPITOR) 20 MG tablet Take 1 tablet (20 mg total) by mouth daily at 6 PM. 90 tablet 3  . metFORMIN (GLUCOPHAGE-XR) 500 MG 24 hr tablet TAKE 1 TABLET BY MOUTH EVERY DAY WITH BREAKFAST 30 tablet 11  . metoprolol tartrate (LOPRESSOR) 25 MG tablet Take 1 tablet (25 mg total) by mouth 2 (two) times daily. 180 tablet 3  . Multiple Vitamin (MULTIVITAMIN WITH MINERALS) TABS tablet Take 1 tablet by mouth daily.    . naphazoline-glycerin (CLEAR EYES REDNESS) 0.012-0.2 % SOLN Place 1-2 drops into both eyes 4 (four) times daily as needed for eye irritation.  0   No current facility-administered medications for this visit.        Physical Exam: BP (!) 148/80 (BP Location: Right Arm, Patient Position: Sitting, Cuff Size: Large)   Pulse (!) 50   Resp 16   Ht 5\' 10"  (1.778 m)   Wt 173 lb 6.4 oz (78.7 kg)   SpO2 97% Comment: RA  BMI 24.88 kg/m   General appearance: alert, cooperative, appears stated age and no distress Neurologic: intact Heart: regular rate and rhythm, S1, S2 normal, no murmur, click, rub or gallop Lungs: clear to auscultation bilaterally Abdomen: soft, non-tender; bowel sounds  normal; no masses,  no organomegaly Extremities: extremities normal, atraumatic, no cyanosis or edema Wound: Patient sternal incision is healing well with sternum is stable, vein harvest sites also healed well   Diagnostic Studies & Laboratory data:     Recent Radiology Findings:   No results found.    Recent Lab Findings: Lab Results  Component Value Date   WBC 9.0 03/08/2018   HGB 11.7 (L) 03/08/2018   HCT 36.2 (L) 03/08/2018   PLT 459 (H) 03/08/2018   GLUCOSE 109 (H) 06/21/2018   CHOL 125 06/21/2018   TRIG 91 06/21/2018   HDL 48 06/21/2018   LDLCALC 59 06/21/2018   ALT 12 06/21/2018   AST 16 06/21/2018   NA  142 06/21/2018   K 4.2 06/21/2018   CL 101 06/21/2018   CREATININE 1.30 (H) 06/21/2018   BUN 16 06/21/2018   CO2 24 06/21/2018   TSH 0.964 02/20/2018   INR 1.45 02/26/2018   HGBA1C 6.4 (H) 04/07/2018      Assessment / Plan:      Patient doing well following coronary artery bypass grafting progressing as expected with cardiac rehab. He has discontinued his amiodarone and decrease his dose of aspirin to 81 mg a day per cardiology recommendations. Patient's son notes that the patient plans on extended trip back to [place].  He will follow-up with cardiology on his return.       Delight Ovens MD      [address] [place] [place] [address] Office [phone]   Beeper [phone]  06/27/2018 6:51 PM

## 2018-06-28 ENCOUNTER — Ambulatory Visit (HOSPITAL_COMMUNITY): Payer: BLUE CROSS/BLUE SHIELD

## 2018-06-28 ENCOUNTER — Telehealth (HOSPITAL_COMMUNITY): Payer: Self-pay

## 2018-06-28 NOTE — Telephone Encounter (Signed)
Attempted to call patient in regards to Cardiac Rehab - LM on VM 

## 2018-06-30 ENCOUNTER — Ambulatory Visit (HOSPITAL_COMMUNITY): Payer: BLUE CROSS/BLUE SHIELD

## 2018-06-30 ENCOUNTER — Other Ambulatory Visit: Payer: BLUE CROSS/BLUE SHIELD

## 2018-07-01 ENCOUNTER — Ambulatory Visit: Payer: BLUE CROSS/BLUE SHIELD | Admitting: Cardiothoracic Surgery

## 2018-07-01 ENCOUNTER — Ambulatory Visit: Payer: BLUE CROSS/BLUE SHIELD | Admitting: Cardiovascular Disease

## 2018-07-02 ENCOUNTER — Ambulatory Visit (HOSPITAL_COMMUNITY): Payer: BLUE CROSS/BLUE SHIELD

## 2018-07-05 ENCOUNTER — Ambulatory Visit (HOSPITAL_COMMUNITY): Payer: BLUE CROSS/BLUE SHIELD

## 2018-07-06 ENCOUNTER — Encounter (HOSPITAL_COMMUNITY): Payer: Self-pay

## 2018-07-06 NOTE — Telephone Encounter (Signed)
Attempted to call pt a 2nd time- LM ON VM ° °Mailed letter out °

## 2018-07-07 ENCOUNTER — Ambulatory Visit (HOSPITAL_COMMUNITY): Payer: BLUE CROSS/BLUE SHIELD

## 2018-07-09 ENCOUNTER — Ambulatory Visit (HOSPITAL_COMMUNITY): Payer: BLUE CROSS/BLUE SHIELD

## 2018-07-12 ENCOUNTER — Ambulatory Visit (HOSPITAL_COMMUNITY): Payer: BLUE CROSS/BLUE SHIELD

## 2018-07-16 ENCOUNTER — Ambulatory Visit (HOSPITAL_COMMUNITY): Payer: BLUE CROSS/BLUE SHIELD

## 2018-07-19 ENCOUNTER — Ambulatory Visit (HOSPITAL_COMMUNITY): Payer: BLUE CROSS/BLUE SHIELD

## 2018-07-22 NOTE — Telephone Encounter (Signed)
Called pt in regards to CR, pt stated he will be out of the country for a few months. And will call when he returns, if he would like to reschedule.  Closed referral

## 2018-07-23 ENCOUNTER — Ambulatory Visit (HOSPITAL_COMMUNITY): Payer: BLUE CROSS/BLUE SHIELD

## 2018-07-26 ENCOUNTER — Ambulatory Visit (HOSPITAL_COMMUNITY): Payer: BLUE CROSS/BLUE SHIELD

## 2018-07-28 ENCOUNTER — Ambulatory Visit (HOSPITAL_COMMUNITY): Payer: BLUE CROSS/BLUE SHIELD

## 2018-07-30 ENCOUNTER — Ambulatory Visit (HOSPITAL_COMMUNITY): Payer: BLUE CROSS/BLUE SHIELD

## 2018-08-02 ENCOUNTER — Ambulatory Visit (HOSPITAL_COMMUNITY): Payer: BLUE CROSS/BLUE SHIELD

## 2018-08-04 ENCOUNTER — Ambulatory Visit (HOSPITAL_COMMUNITY): Payer: BLUE CROSS/BLUE SHIELD

## 2018-08-06 ENCOUNTER — Ambulatory Visit (HOSPITAL_COMMUNITY): Payer: BLUE CROSS/BLUE SHIELD

## 2018-08-09 ENCOUNTER — Ambulatory Visit (HOSPITAL_COMMUNITY): Payer: BLUE CROSS/BLUE SHIELD

## 2018-08-11 ENCOUNTER — Ambulatory Visit (HOSPITAL_COMMUNITY): Payer: BLUE CROSS/BLUE SHIELD

## 2018-08-13 ENCOUNTER — Ambulatory Visit (HOSPITAL_COMMUNITY): Payer: BLUE CROSS/BLUE SHIELD

## 2018-08-16 ENCOUNTER — Ambulatory Visit (HOSPITAL_COMMUNITY): Payer: BLUE CROSS/BLUE SHIELD

## 2018-08-18 ENCOUNTER — Ambulatory Visit (HOSPITAL_COMMUNITY): Payer: BLUE CROSS/BLUE SHIELD

## 2018-08-20 ENCOUNTER — Ambulatory Visit (HOSPITAL_COMMUNITY): Payer: BLUE CROSS/BLUE SHIELD

## 2018-08-23 ENCOUNTER — Ambulatory Visit (HOSPITAL_COMMUNITY): Payer: BLUE CROSS/BLUE SHIELD

## 2018-08-25 ENCOUNTER — Ambulatory Visit (HOSPITAL_COMMUNITY): Payer: BLUE CROSS/BLUE SHIELD

## 2018-08-27 ENCOUNTER — Ambulatory Visit (HOSPITAL_COMMUNITY): Payer: BLUE CROSS/BLUE SHIELD

## 2018-08-30 ENCOUNTER — Ambulatory Visit (HOSPITAL_COMMUNITY): Payer: BLUE CROSS/BLUE SHIELD

## 2018-09-01 ENCOUNTER — Ambulatory Visit (HOSPITAL_COMMUNITY): Payer: BLUE CROSS/BLUE SHIELD

## 2018-09-03 ENCOUNTER — Ambulatory Visit (HOSPITAL_COMMUNITY): Payer: BLUE CROSS/BLUE SHIELD

## 2018-09-06 ENCOUNTER — Ambulatory Visit (HOSPITAL_COMMUNITY): Payer: BLUE CROSS/BLUE SHIELD

## 2018-09-08 ENCOUNTER — Ambulatory Visit (HOSPITAL_COMMUNITY): Payer: BLUE CROSS/BLUE SHIELD

## 2018-09-10 ENCOUNTER — Ambulatory Visit (HOSPITAL_COMMUNITY): Payer: BLUE CROSS/BLUE SHIELD

## 2018-09-13 ENCOUNTER — Ambulatory Visit (HOSPITAL_COMMUNITY): Payer: BLUE CROSS/BLUE SHIELD

## 2018-09-15 ENCOUNTER — Ambulatory Visit (HOSPITAL_COMMUNITY): Payer: BLUE CROSS/BLUE SHIELD

## 2018-09-17 ENCOUNTER — Ambulatory Visit (HOSPITAL_COMMUNITY): Payer: BLUE CROSS/BLUE SHIELD

## 2018-09-20 ENCOUNTER — Ambulatory Visit (HOSPITAL_COMMUNITY): Payer: BLUE CROSS/BLUE SHIELD

## 2018-09-22 ENCOUNTER — Ambulatory Visit (HOSPITAL_COMMUNITY): Payer: BLUE CROSS/BLUE SHIELD

## 2018-09-24 ENCOUNTER — Ambulatory Visit (HOSPITAL_COMMUNITY): Payer: BLUE CROSS/BLUE SHIELD

## 2018-09-27 ENCOUNTER — Ambulatory Visit (HOSPITAL_COMMUNITY): Payer: BLUE CROSS/BLUE SHIELD

## 2018-09-29 ENCOUNTER — Ambulatory Visit (HOSPITAL_COMMUNITY): Payer: BLUE CROSS/BLUE SHIELD

## 2018-11-07 ENCOUNTER — Other Ambulatory Visit: Payer: Self-pay | Admitting: Cardiovascular Disease

## 2018-11-15 ENCOUNTER — Other Ambulatory Visit: Payer: Self-pay | Admitting: Cardiovascular Disease

## 2018-11-15 MED ORDER — METOPROLOL TARTRATE 25 MG PO TABS: 25.0000 mg | ORAL_TABLET | Freq: Two times a day (BID) | ORAL | 2 refills | Status: DC

## 2018-11-15 MED ORDER — ATORVASTATIN CALCIUM 20 MG PO TABS: 20.0000 mg | ORAL_TABLET | Freq: Every day | ORAL | 2 refills | Status: DC

## 2018-11-15 NOTE — Addendum Note (Signed)
Addended by: Demetrios Loll on: 11/15/2018 11:37 AM   Modules accepted: Orders

## 2019-06-22 ENCOUNTER — Ambulatory Visit: Payer: BLUE CROSS/BLUE SHIELD | Admitting: Cardiovascular Disease

## 2019-08-17 ENCOUNTER — Ambulatory Visit (INDEPENDENT_AMBULATORY_CARE_PROVIDER_SITE_OTHER): Payer: Medicaid Other | Admitting: Cardiovascular Disease

## 2019-08-17 ENCOUNTER — Encounter: Payer: Self-pay | Admitting: Cardiovascular Disease

## 2019-08-17 ENCOUNTER — Other Ambulatory Visit: Payer: Self-pay

## 2019-08-17 VITALS — BP 120/68 | HR 70 | Ht 70.0 in | Wt 179.0 lb

## 2019-08-17 DIAGNOSIS — I9789 Other postprocedural complications and disorders of the circulatory system, not elsewhere classified: Secondary | ICD-10-CM | POA: Diagnosis not present

## 2019-08-17 DIAGNOSIS — I2510 Atherosclerotic heart disease of native coronary artery without angina pectoris: Secondary | ICD-10-CM | POA: Diagnosis not present

## 2019-08-17 DIAGNOSIS — E782 Mixed hyperlipidemia: Secondary | ICD-10-CM

## 2019-08-17 DIAGNOSIS — I4891 Unspecified atrial fibrillation: Secondary | ICD-10-CM

## 2019-08-17 LAB — BASIC METABOLIC PANEL
BUN/Creatinine Ratio: 13 (ref 10–24)
BUN: 19 mg/dL (ref 8–27)
CO2: 24 mmol/L (ref 20–29)
Calcium: 9.6 mg/dL (ref 8.6–10.2)
Chloride: 100 mmol/L (ref 96–106)
Creatinine, Ser: 1.43 mg/dL — ABNORMAL HIGH (ref 0.76–1.27)
GFR calc Af Amer: 56 mL/min/{1.73_m2} — ABNORMAL LOW (ref >59)
GFR calc non Af Amer: 48 mL/min/{1.73_m2} — ABNORMAL LOW (ref >59)
Glucose: 129 mg/dL — ABNORMAL HIGH (ref 65–99)
Potassium: 4.3 mmol/L (ref 3.5–5.2)
Sodium: 140 mmol/L (ref 134–144)

## 2019-08-17 LAB — LIPID PANEL
Chol/HDL Ratio: 2.4 ratio (ref 0.0–5.0)
Cholesterol, Total: 139 mg/dL (ref 100–199)
HDL: 58 mg/dL (ref >39)
LDL Chol Calc (NIH): 57 mg/dL (ref 0–99)
Triglycerides: 141 mg/dL (ref 0–149)
VLDL Cholesterol Cal: 24 mg/dL (ref 5–40)

## 2019-08-17 LAB — HEPATIC FUNCTION PANEL
ALT: 20 IU/L (ref 0–44)
AST: 24 IU/L (ref 0–40)
Albumin: 4.4 g/dL (ref 3.7–4.7)
Alkaline Phosphatase: 87 IU/L (ref 39–117)
Bilirubin Total: 1.8 mg/dL — ABNORMAL HIGH (ref 0.0–1.2)
Bilirubin, Direct: 0.49 mg/dL — ABNORMAL HIGH (ref 0.00–0.40)
Total Protein: 7.1 g/dL (ref 6.0–8.5)

## 2019-08-17 MED ORDER — METOPROLOL TARTRATE 25 MG PO TABS: 25.0000 mg | ORAL_TABLET | Freq: Two times a day (BID) | ORAL | 3 refills | Status: DC

## 2019-08-17 MED ORDER — ATORVASTATIN CALCIUM 20 MG PO TABS: 20.0000 mg | ORAL_TABLET | Freq: Every day | ORAL | 3 refills | Status: DC

## 2019-08-17 NOTE — Progress Notes (Signed)
Cardiology Office Note:    Date:  08/17/2019   ID:  Shane Yates, DOB [birth year], MRN [id number]  PCP:  Patient, No Pcp Per  Cardiologist:  Shane Miss, MD   Referring MD: No ref. provider found   Chief Complaint  Patient presents with  . Coronary Artery Disease    History of Present Illness:    Shane Yates is a 74 y.o. male with a hx of coronary artery disease-status post coronary artery bypass grafting. She is seen with family members who helped interpret.  No angina .  Has normal chest soreness.  Has some shortness of breath   Was having some oozing from his SVG harvest site  CXR shows small pleural effusions, left > right  Lasix has been continued.   April 15, 2018: Shane Yates is seen today for follow-up visit.  He has a history of coronary artery bypass grafting August, 2019.   He had postoperative atrial fibrillation and was started on amiodarone.  He saw Shane Yates, nurse practitioner last week for sinus tachycardia.  There is no evidence of recurrent atrial fibrillation. Amiodarone was continued at 200 mg a day.  Metoprolol was increased from 12.5 mg BID to 25 mg BID ( previous note by NP says it was increased to 50 mg BID but this is an error )   August 17, 2019:  Shane Yates is seen today in follow-up for his coronary artery disease and CABG in August, 2019.  He had some postoperative atrial fibrillation and was treated with amiodarone for several months. Seen with interprerter  - Shane Yates. Pt called son and had on speaker phone during visit   Past Medical History:  Diagnosis Date  . Atrial fibrillation with RVR (HCC)   . Chest pain   . Coronary artery disease   . Diabetes mellitus without complication (HCC)   . EtOH dependence (HCC)   . Hyperlipidemia   . Hypertension   . MI (myocardial infarction) [facility])     Past Surgical History:  Procedure Laterality Date  . APPENDECTOMY    . CORONARY ARTERY BYPASS GRAFT N/A 02/26/2018   Procedure: CORONARY  ARTERY BYPASS GRAFTING (CABG), ON PUMP, TIMES FOUR, USING LEFT INTERNAL MAMMARY ARTERY AND ENDOSCOPICALLY HARVESTED BILATERAL GREATER SAPHENOUS VEINS;  Surgeon: Delight Ovens, MD;  Location: [facility] OR;  Service: Open Heart Surgery;  Laterality: N/A;  Right SVG SEQ to Distal Circumflex and Ramus, Left SVG to PDA, LIMA to LAD  . LEFT HEART CATH AND CORONARY ANGIOGRAPHY N/A 02/23/2018   Procedure: LEFT HEART CATH AND CORONARY ANGIOGRAPHY;  Surgeon: Corky Crafts, MD;  Location[facility] INVASIVE CV LAB;  Service: Cardiovascular;  Laterality: N/A;  . TEE WITHOUT CARDIOVERSION N/A 02/26/2018   Procedure: TRANSESOPHAGEAL ECHOCARDIOGRAM (TEE);  Surgeon: Delight Ovens, MD;  Location: [facility] OR;  Service: Open Heart Surgery;  Laterality: N/A;    Current Medications: Current Meds  Medication Sig  . aspirin EC 81 MG tablet Take 1 tablet (81 mg total) by mouth daily.  Marland Kitchen atorvastatin (LIPITOR) 20 MG tablet Take 1 tablet (20 mg total) by mouth daily at 6 PM.  . metFORMIN (GLUCOPHAGE-XR) 500 MG 24 hr tablet TAKE 1 TABLET BY MOUTH EVERY DAY WITH BREAKFAST  . metoprolol tartrate (LOPRESSOR) 25 MG tablet Take 1 tablet (25 mg total) by mouth 2 (two) times daily.  . Multiple Vitamin (MULTIVITAMIN WITH MINERALS) TABS tablet Take 1 tablet by mouth daily.  . naphazoline-glycerin (CLEAR EYES REDNESS) 0.012-0.2 % SOLN Place 1-2 drops into  both eyes 4 (four) times daily as needed for eye irritation.  . [DISCONTINUED] atorvastatin (LIPITOR) 20 MG tablet Take 1 tablet (20 mg total) by mouth daily at 6 PM.  . [DISCONTINUED] metoprolol tartrate (LOPRESSOR) 25 MG tablet Take 1 tablet (25 mg total) by mouth 2 (two) times daily.     Allergies:   Patient has no known allergies.   Social History   Socioeconomic History  . Marital status: Married    Spouse name: Not on file  . Number of children: Not on file  . Years of education: Not on file  . Highest education level: Not on file  Occupational History  . Not on file   Tobacco Use  . Smoking status: Never Smoker  . Smokeless tobacco: Never Used  Substance and Sexual Activity  . Alcohol use: Not on file    Comment: Per son drinks nightly about 113ml of liquor, last drink last night 02/19/18  . Drug use: Never  . Sexual activity: Not on file  Other Topics Concern  . Not on file  Social History Narrative  . Not on file   Social Determinants of Health   Financial Resource Strain:   . Difficulty of Paying Living Expenses: Not on file  Food Insecurity:   . Worried About Charity fundraiser in the Last Year: Not on file  . Ran Out of Food in the Last Year: Not on file  Transportation Needs:   . Lack of Transportation (Medical): Not on file  . Lack of Transportation (Non-Medical): Not on file  Physical Activity:   . Days of Exercise per Week: Not on file  . Minutes of Exercise per Session: Not on file  Stress:   . Feeling of Stress : Not on file  Social Connections:   . Frequency of Communication with Friends and Family: Not on file  . Frequency of Social Gatherings with Friends and Family: Not on file  . Attends Religious Services: Not on file  . Active Member of Clubs or Organizations: Not on file  . Attends Archivist Meetings: Not on file  . Marital Status: Not on file     Family History: The patient's family history includes Hypertension in his father.  ROS:   Please see the history of present illness.     All other systems reviewed and are negative.  EKGs/Labs/Other Studies Reviewed:    The following studies were reviewed today:     Recent Labs: 08/17/2019: ALT 20; BUN 19; Creatinine, Ser 1.43; Potassium 4.3; Sodium 140  Recent Lipid Panel    Component Value Date/Time   CHOL 139 08/17/2019 1121   TRIG 141 08/17/2019 1121   HDL 58 08/17/2019 1121   CHOLHDL 2.4 08/17/2019 1121   CHOLHDL 3.6 02/20/2018 1046   VLDL 27 02/20/2018 1046   LDLCALC 57 08/17/2019 1121    Physical Exam: Blood pressure 120/68, pulse 70,  height 5\' 10"  (1.778 m), weight 179 lb (81.2 kg), SpO2 97 %.  GEN:  Well nourished, well developed in no acute distress HEENT: Normal NECK: No JVD; No carotid bruits LYMPHATICS: No lymphadenopathy CARDIAC: RRR .  His sternotomy scar is well-healed on the upper aspect.  The lower aspect have has extensive keloid formation. RESPIRATORY:  Clear to auscultation without rales, wheezing or rhonchi  ABDOMEN: Soft, non-tender, non-distended MUSCULOSKELETAL:  No edema; No deformity he saphenous vein harvest sites are well-healed. SKIN: Warm and dry NEUROLOGIC:  Alert and oriented x 3   EKG:  Normal sinus sinus rhythm at 70.  No ST or T wave changes.  ASSESSMENT:    1. Mixed hyperlipidemia   2. Postoperative atrial fibrillation (HCC)   3. Coronary artery disease involving native coronary artery of native heart without angina pectoris    PLAN:    In order of problems listed above:  1. 1.  Coronary artery disease: He is not having any episodes of angina.  He complains of some tenderness of the lower aspect of his sternotomy.  I recommend that he consult with a plastic surgeon to see if there is anything to do about the keloid formation.  I am not sure that there is any way to avoid keloid formation even if the current keloid is cut out.  Continue atorvastatin.  Continue aspirin. He asked about some exercise and rehab.  Unfortunately a rehab program is closed for now-mostly due to Covid and the need for the rehab nurses to work elsewhere in the hospital.    2.  Postoperative atrial fibrillation: No episodes of recurrent atrial fibrillation.  Continue current medications.    Medication Adjustments/Labs and Tests Ordered: Current medicines are reviewed at length with the patient today.  Concerns regarding medicines are outlined above.  Orders Placed This Encounter  Procedures  . Lipid panel  . Hepatic function panel  . Basic metabolic panel  . EKG 12-Lead   Meds ordered this  encounter  Medications  . atorvastatin (LIPITOR) 20 MG tablet    Sig: Take 1 tablet (20 mg total) by mouth daily at 6 PM.    Dispense:  90 tablet    Refill:  3  . metoprolol tartrate (LOPRESSOR) 25 MG tablet    Sig: Take 1 tablet (25 mg total) by mouth 2 (two) times daily.    Dispense:  180 tablet    Refill:  3    Patient Instructions  Medication Instructions:  Your physician recommends that you continue on your current medications as directed. Please refer to the Current Medication list given to you today.  *If you need a refill on your cardiac medications before your next appointment, please call your pharmacy*  Lab Work: BMET, Liver, and Lipid today  If you have labs (blood work) drawn today and your tests are completely normal, you will receive your results only by: Marland Kitchen MyChart Message (if you have MyChart) OR . A paper copy in the mail If you have any lab test that is abnormal or we need to change your treatment, we will call you to review the results.  Testing/Procedures: None  Follow-Up: At [facility], you and your health needs are our priority.  As part of our continuing mission to provide you with exceptional heart care, we have created designated Provider Care Teams.  These Care Teams include your primary Cardiologist (physician) and Advanced Practice Providers (APPs -  Physician Assistants and Nurse Practitioners) who all work together to provide you with the care you need, when you need it.  Your next appointment:   6 month(s)  The format for your next appointment:   In Person  Provider:   You may see Shane Miss, MD or one of the following Advanced Practice Providers on your designated Care Team:    Tereso Newcomer, PA-C  Vin [place], [place]  Berton Bon, NP   Other Instructions      Signed, Shane Miss, MD  08/17/2019 6:15 PM    [facility]

## 2019-08-17 NOTE — Patient Instructions (Signed)
Medication Instructions:  Your physician recommends that you continue on your current medications as directed. Please refer to the Current Medication list given to you today.  *If you need a refill on your cardiac medications before your next appointment, please call your pharmacy*  Lab Work: BMET, Liver, and Lipid today  If you have labs (blood work) drawn today and your tests are completely normal, you will receive your results only by: Marland Kitchen MyChart Message (if you have MyChart) OR . A paper copy in the mail If you have any lab test that is abnormal or we need to change your treatment, we will call you to review the results.  Testing/Procedures: None  Follow-Up: At [facility], you and your health needs are our priority.  As part of our continuing mission to provide you with exceptional heart care, we have created designated Provider Care Teams.  These Care Teams include your primary Cardiologist (physician) and Advanced Practice Providers (APPs -  Physician Assistants and Nurse Practitioners) who all work together to provide you with the care you need, when you need it.  Your next appointment:   6 month(s)  The format for your next appointment:   In Person  Provider:   You may see Kristeen Miss, MD or one of the following Advanced Practice Providers on your designated Care Team:    Tereso Newcomer, PA-C  [place]in [place], [place]  Berton Bon, NP   Other Instructions

## 2020-04-13 ENCOUNTER — Ambulatory Visit: Payer: Medicaid Other | Admitting: Cardiovascular Disease

## 2020-04-13 ENCOUNTER — Other Ambulatory Visit: Payer: Self-pay

## 2020-04-13 MED ORDER — ATORVASTATIN CALCIUM 20 MG PO TABS: 20.0000 mg | ORAL_TABLET | Freq: Every day | ORAL | 1 refills | Status: DC

## 2020-04-13 NOTE — Telephone Encounter (Signed)
Pt's medication was sent to pt's pharmacy as requested. Confirmation received.  °

## 2020-05-16 ENCOUNTER — Ambulatory Visit: Payer: Medicaid Other | Admitting: Cardiovascular Disease

## 2020-06-10 ENCOUNTER — Encounter: Payer: Self-pay | Admitting: Cardiovascular Disease

## 2020-06-10 NOTE — Progress Notes (Signed)
This encounter was created in error - please disregard.

## 2020-06-11 ENCOUNTER — Encounter: Payer: Medicaid Other | Admitting: Cardiovascular Disease

## 2020-06-21 IMAGING — DX DG CHEST 2V
2 series · 2 of 2 positions shown · non-contrast
Comparison: None.

CLINICAL DATA: Chest pain for 2 hours, pain down RIGHT arm.

EXAM:
CHEST - 2 VIEW

[chest pa]
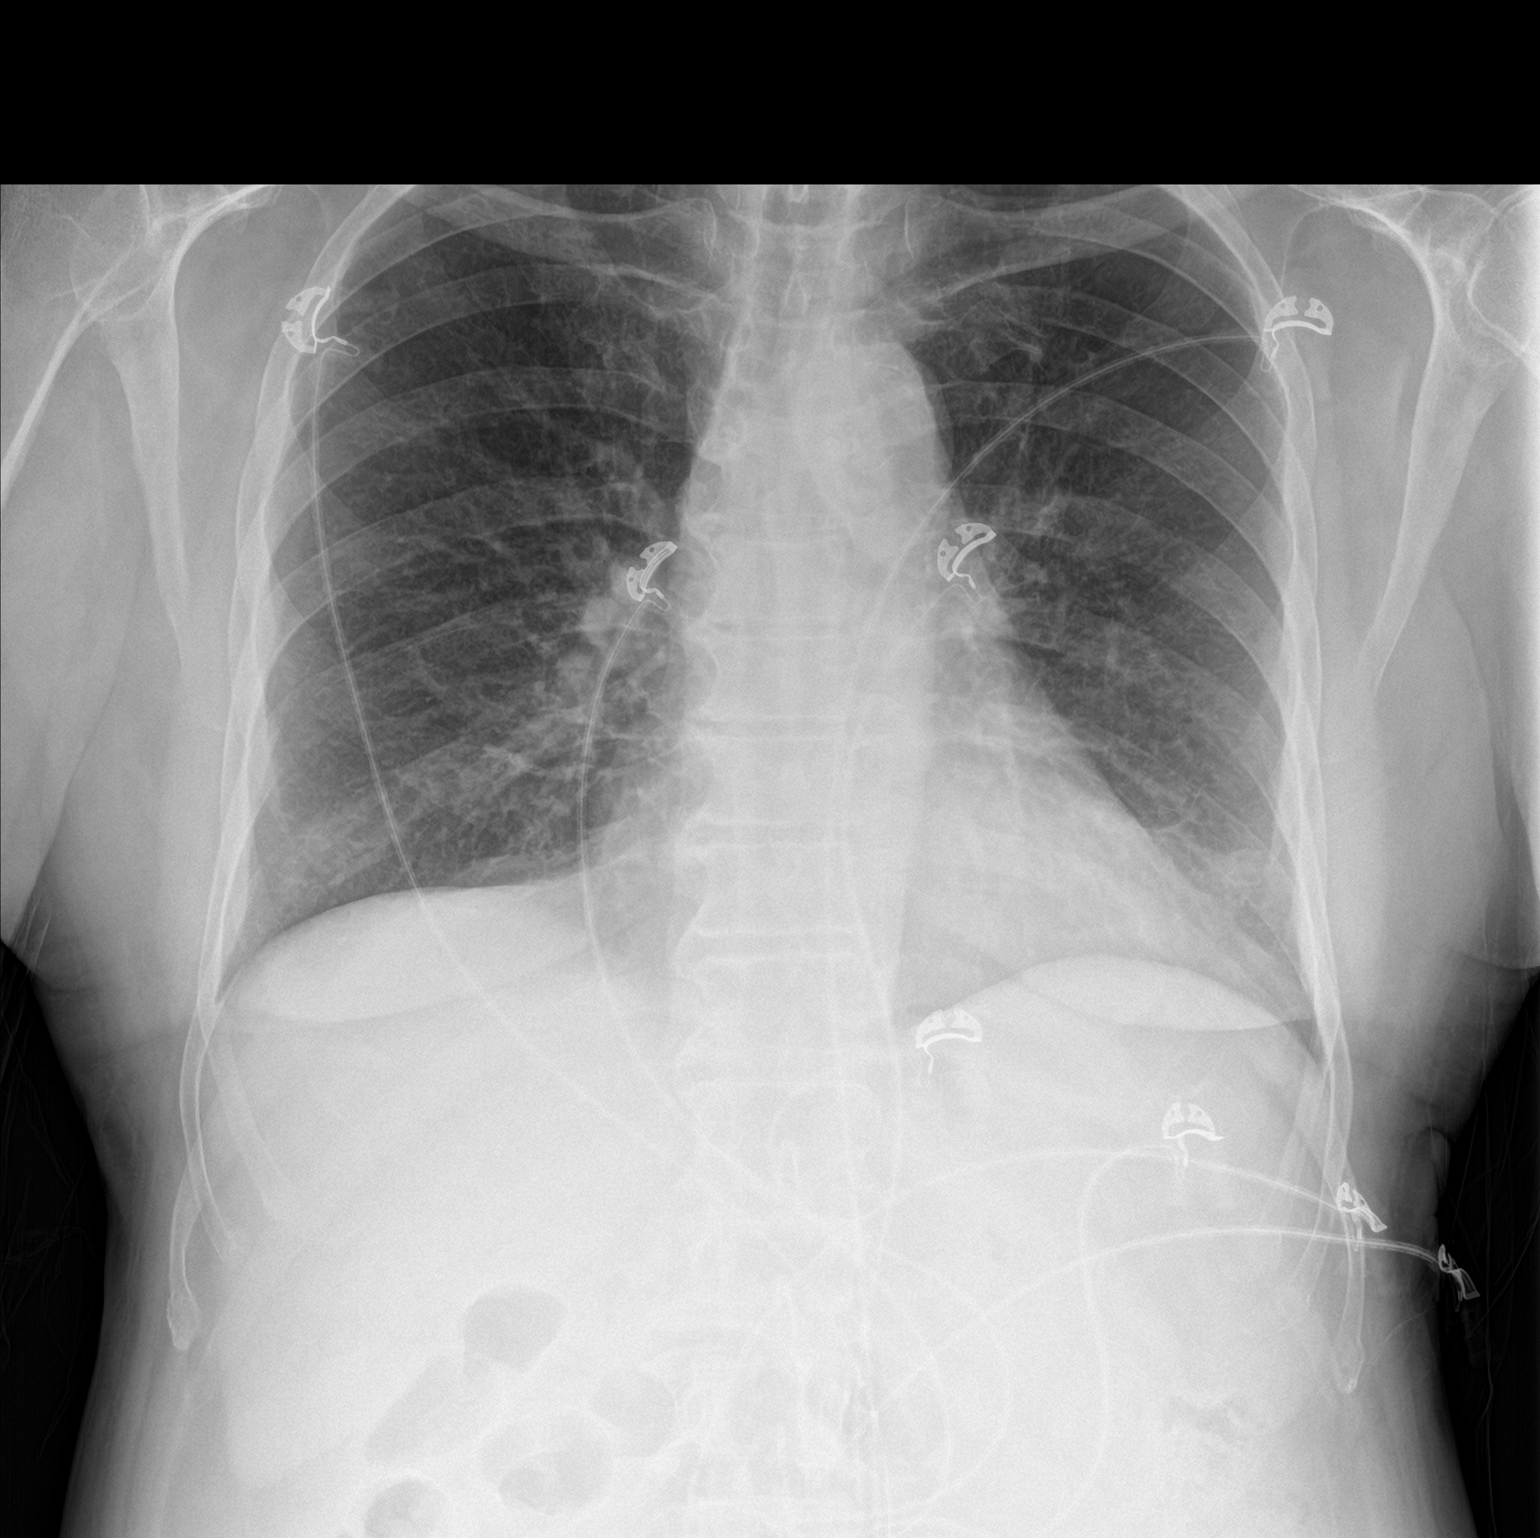

[chest lat]
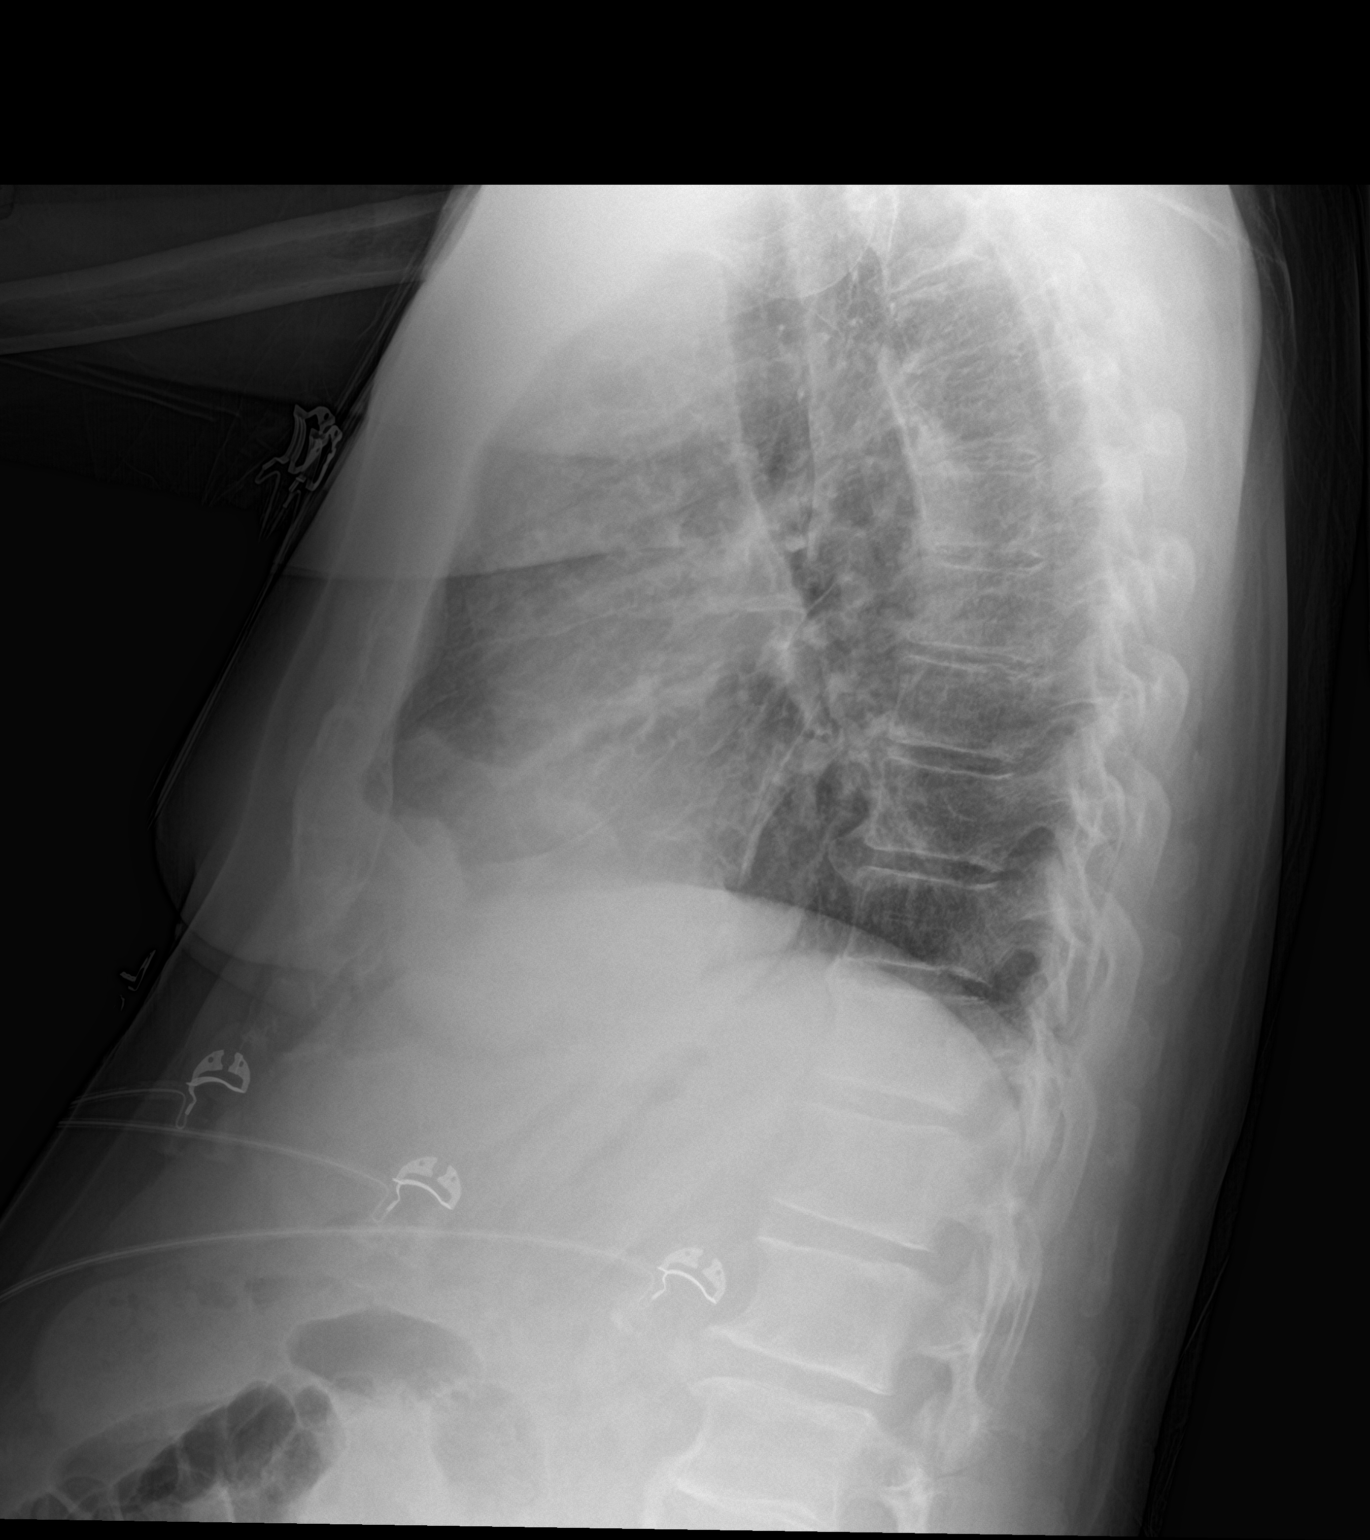

[2 of 2 positions shown; findings below may reference images not displayed]

FINDINGS: The heart size and mediastinal contours are within normal limits.
Both lungs are clear. The visualized skeletal structures are
unremarkable.
IMPRESSION: No active cardiopulmonary disease.

## 2020-06-29 IMAGING — DX DG CHEST 1V PORT
1 series · 1 of 1 positions shown · non-contrast
Comparison: Yesterday

CLINICAL DATA: History of CABG

EXAM:
PORTABLE CHEST 1 VIEW

[chest ap]
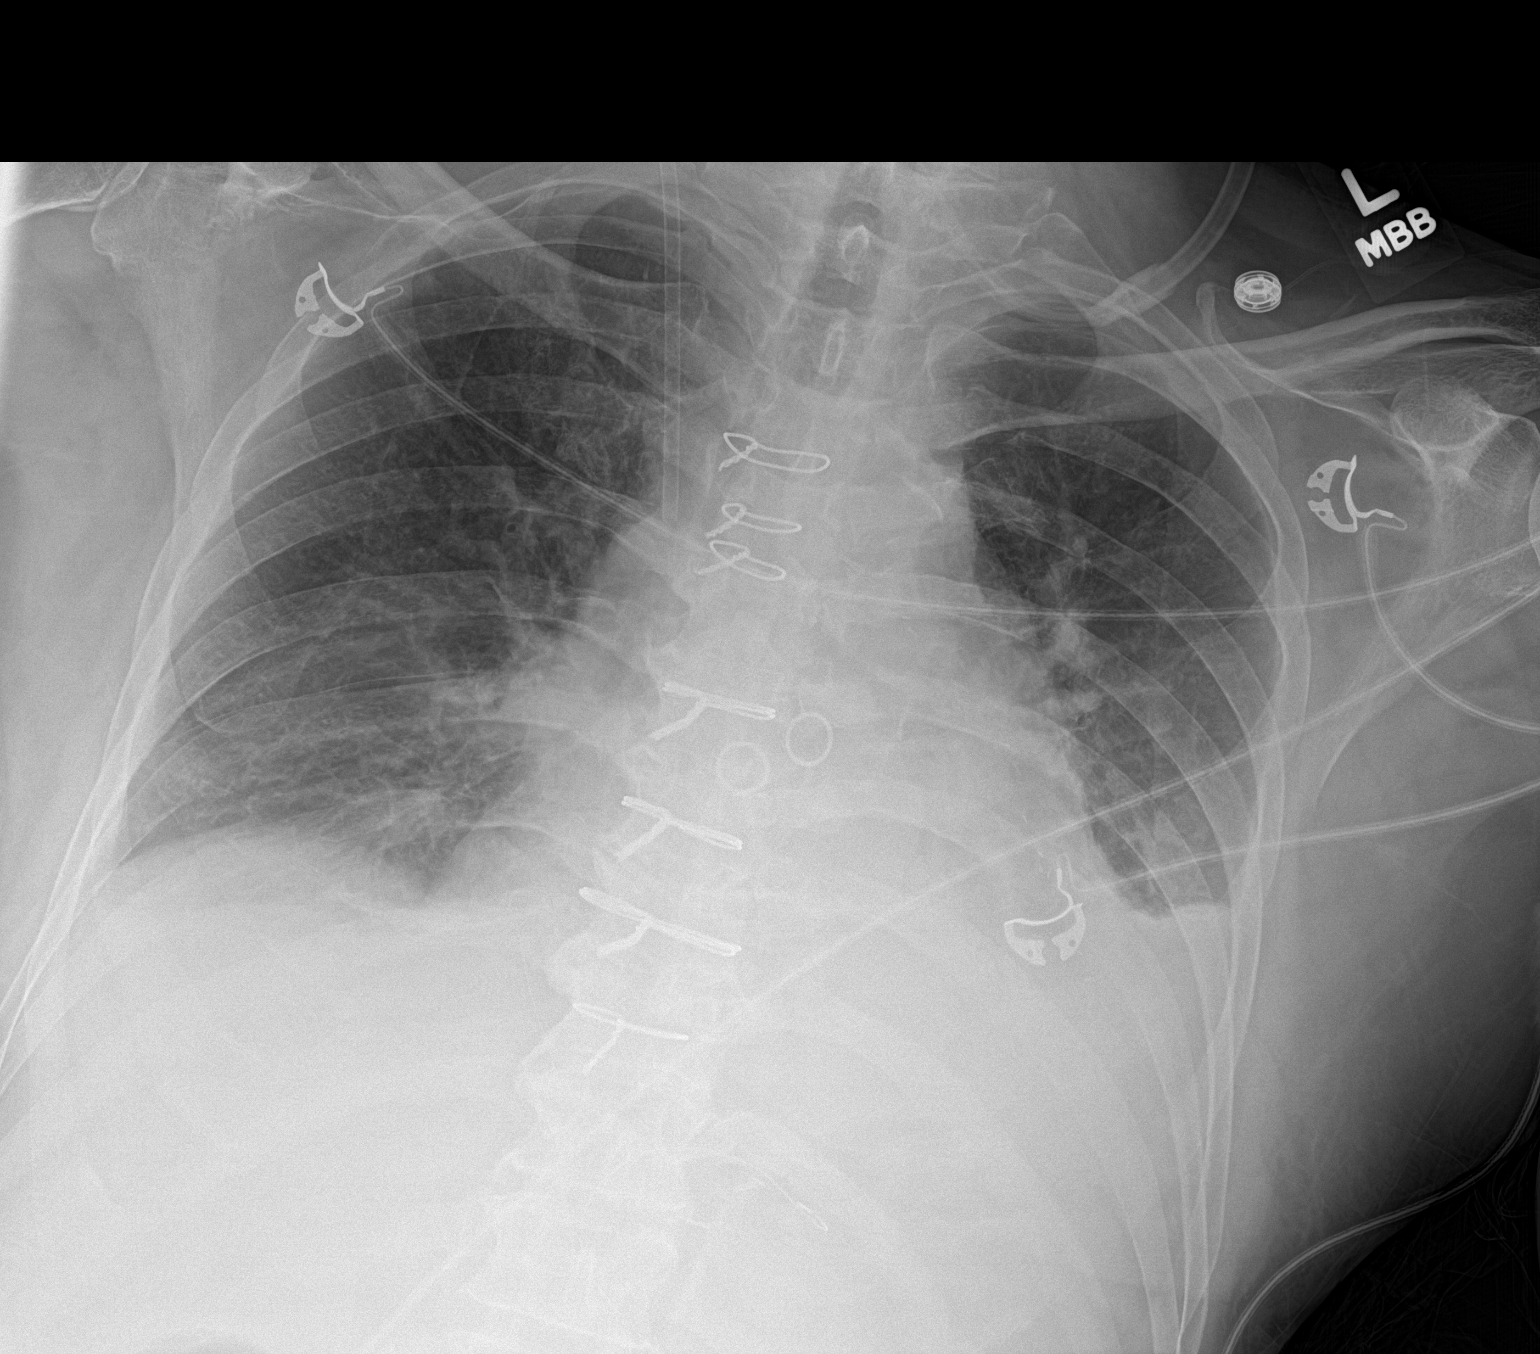

[1 of 1 positions shown; findings below may reference images not displayed]

FINDINGS: Right IJ sheath in place. Low volume chest with atelectasis and left
pleural effusion. No visible pneumothorax. Stable cardiopericardial
enlargement and vascular pedicle widening. CABG.
IMPRESSION: 1. Low volume chest with atelectasis and left pleural effusion.
2. No visible pneumothorax after chest tube removal.

## 2020-06-30 IMAGING — DX DG CHEST 2V
2 series · 2 of 2 positions shown · non-contrast
Comparison: 02/28/2018

CLINICAL DATA: Shortness of breath, incisional pain

EXAM:
CHEST - 2 VIEW

[chest pa]
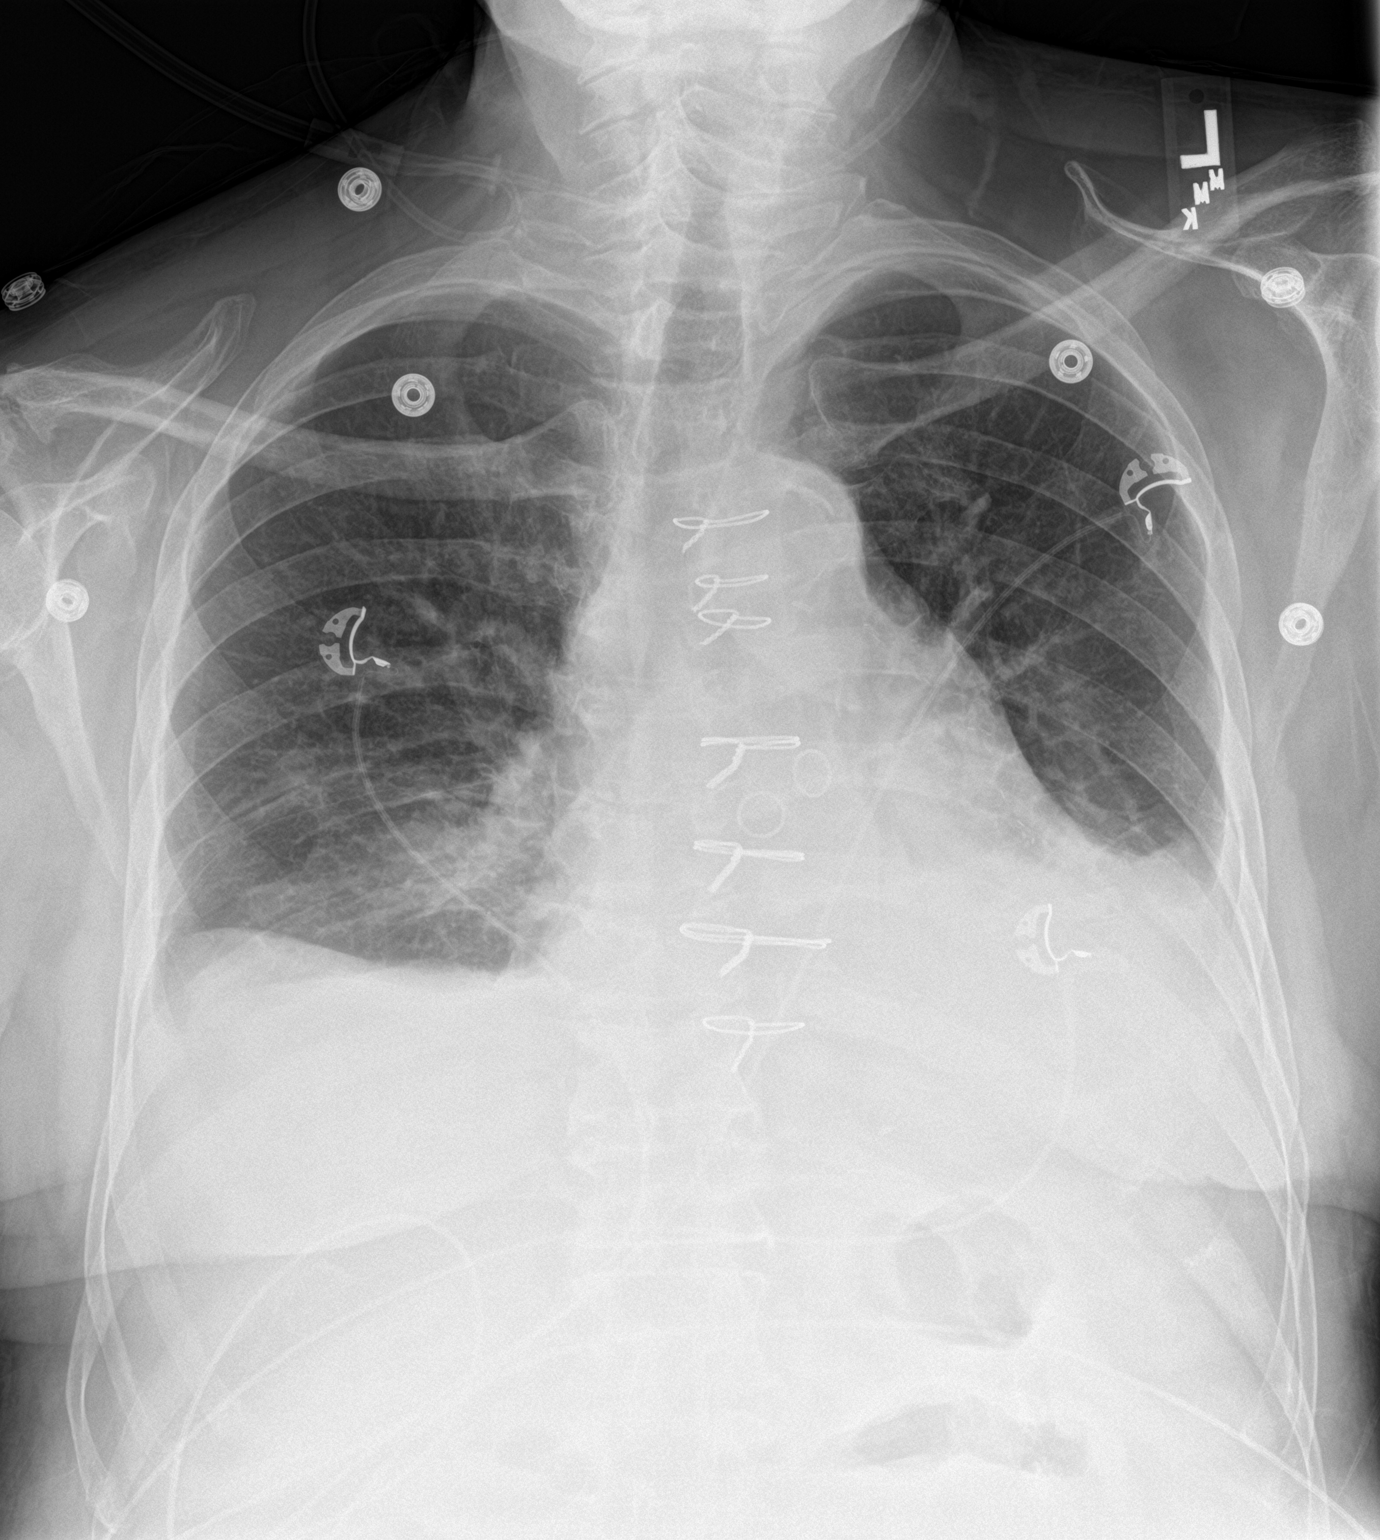

[chest lat]
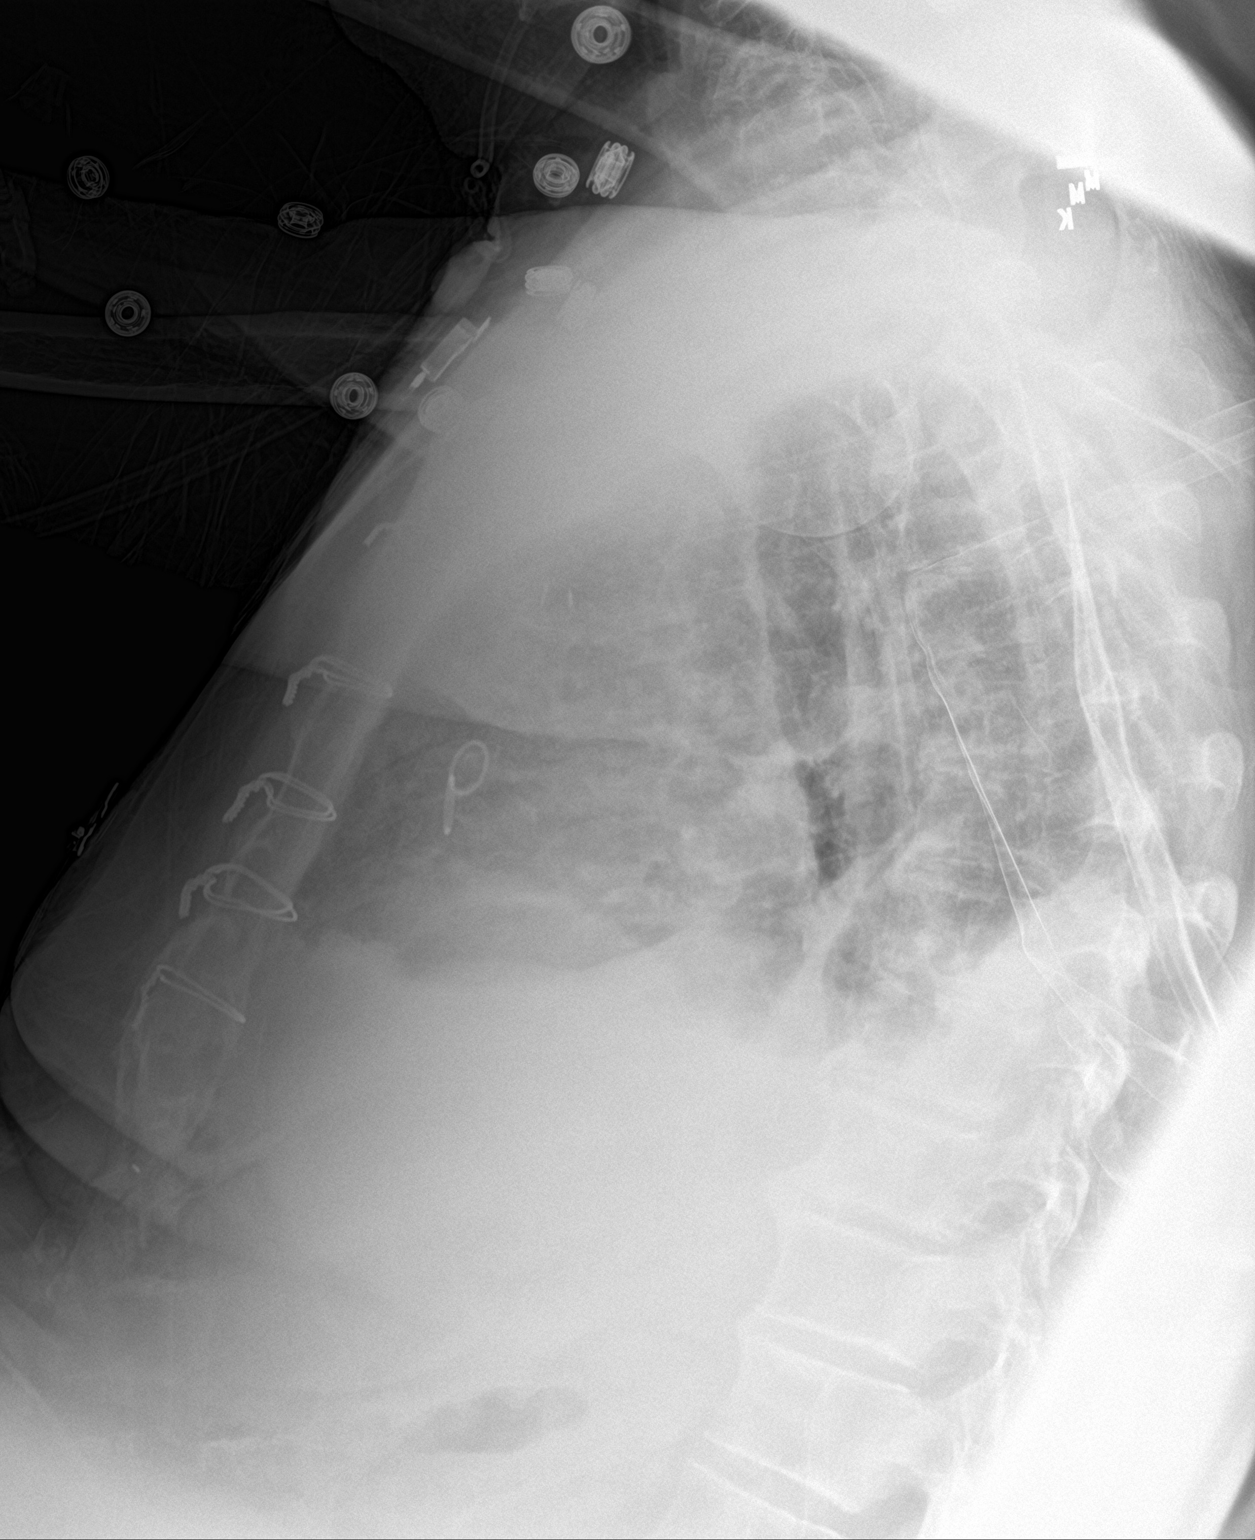

[2 of 2 positions shown; findings below may reference images not displayed]

FINDINGS: There is a trace right pleural effusion. There is a small left
pleural effusion. There is no focal consolidation. There is mild
bilateral interstitial thickening. There is no pneumothorax. There
is stable cardiomegaly. There is evidence of prior CABG.

The osseous structures are unremarkable.
IMPRESSION: Trace right and small left pleural effusion.

## 2020-07-01 ENCOUNTER — Other Ambulatory Visit: Payer: Self-pay | Admitting: Physician Assistant

## 2020-07-01 MED ORDER — METOPROLOL TARTRATE 25 MG PO TABS: 25.0000 mg | ORAL_TABLET | Freq: Two times a day (BID) | ORAL | 0 refills | Status: DC

## 2020-07-02 ENCOUNTER — Ambulatory Visit: Payer: Medicaid Other | Admitting: Cardiovascular Disease

## 2020-07-26 ENCOUNTER — Ambulatory Visit: Payer: Medicaid Other | Admitting: Cardiovascular Disease

## 2020-10-17 ENCOUNTER — Other Ambulatory Visit: Payer: Self-pay | Admitting: Physician Assistant

## 2020-10-30 ENCOUNTER — Ambulatory Visit: Payer: Medicaid Other | Admitting: Cardiovascular Disease

## 2020-10-31 ENCOUNTER — Telehealth: Payer: Self-pay | Admitting: Cardiovascular Disease

## 2020-10-31 DIAGNOSIS — E782 Mixed hyperlipidemia: Secondary | ICD-10-CM

## 2020-10-31 NOTE — Telephone Encounter (Signed)
Called patient son OK per DPR.  Son wants to know if patient needs to come to 11/02/20 appointment fasting.  I spoke with Dr. Elease Hashimoto he is ok with placing orders for BMP, FLP, and ALT.  Patient appointment is for 11:40am this will allow him to eat prior to appointment. Son is aware and will bring patient to appointment on 11/02/20.

## 2020-10-31 NOTE — Telephone Encounter (Signed)
PT's son is calling in to confirm if his father needs bloodwork before his appt.Please advise

## 2020-11-01 ENCOUNTER — Encounter: Payer: Self-pay | Admitting: Cardiovascular Disease

## 2020-11-01 NOTE — Progress Notes (Signed)
Cardiology Office Note:    Date:  11/02/2020   ID:  Shane Yates, DOB Sep 14, 1945, MRN [id number]  PCP:  Patient, No Pcp Per (Inactive)  Cardiologist:  Kristeen Miss, MD   Referring MD: No ref. provider found   Chief Complaint  Patient presents with  . Atrial Fibrillation  . Coronary Artery Disease    Prior notes:    Shane Yates is a 75 y.o. male with a hx of coronary artery disease-status post coronary artery bypass grafting. She is seen with family members who helped interpret.  No angina .  Has normal chest soreness.  Has some shortness of breath   Was having some oozing from his SVG harvest site  CXR shows small pleural effusions, left > right  Lasix has been continued.   April 15, 2018: Shane Yates is seen today for follow-up visit.  He has a history of coronary artery bypass grafting August, 2019.   He had postoperative atrial fibrillation and was started on amiodarone.  He saw Wende Bushy, nurse practitioner last week for sinus tachycardia.  There is no evidence of recurrent atrial fibrillation. Amiodarone was continued at 200 mg a day.  Metoprolol was increased from 12.5 mg BID to 25 mg BID ( previous note by NP says it was increased to 50 mg BID but this is an error )   August 17, 2019:  Shane Yates is seen today in follow-up for his coronary artery disease and CABG in August, 2019.  He had some postoperative atrial fibrillation and was treated with amiodarone for several months. Seen with interprerter  - [name]. Pt called son and had on speaker phone during visit    November 02, 2020:  Shane Yates is seen today for follow up of his CAD / CABG.  Post op Afib - was treated with amiodarone for several months . No CP ,    Has incisional pain  Walks quite a bit.   Hx of post op Afib.   , hx of HLD    Past Medical History:  Diagnosis Date  . Atrial fibrillation with RVR (HCC)   . Chest pain   . Coronary artery disease   . Diabetes mellitus without complication  (HCC)   . EtOH dependence (HCC)   . Hyperlipidemia   . Hypertension   . MI (myocardial infarction) [facility])     Past Surgical History:  Procedure Laterality Date  . APPENDECTOMY    . CORONARY ARTERY BYPASS GRAFT N/A 02/26/2018   Procedure: CORONARY ARTERY BYPASS GRAFTING (CABG), ON PUMP, TIMES FOUR, USING LEFT INTERNAL MAMMARY ARTERY AND ENDOSCOPICALLY HARVESTED BILATERAL GREATER SAPHENOUS VEINS;  Surgeon: Delight Ovens, MD;  Location: [facility] OR;  Service: Open Heart Surgery;  Laterality: N/A;  Right SVG SEQ to Distal Circumflex and Ramus, Left SVG to PDA, LIMA to LAD  . LEFT HEART CATH AND CORONARY ANGIOGRAPHY N/A 02/23/2018   Procedure: LEFT HEART CATH AND CORONARY ANGIOGRAPHY;  Surgeon: Corky Crafts, MD;  Location: [facility] INVASIVE CV LAB;  Service: Cardiovascular;  Laterality: N/A;  . TEE WITHOUT CARDIOVERSION N/A 02/26/2018   Procedure: TRANSESOPHAGEAL ECHOCARDIOGRAM (TEE);  Surgeon: Delight Ovens, MD;  Location: [facility] OR;  Service: Open Heart Surgery;  Laterality: N/A;    Current Medications: Current Meds  Medication Sig  . aspirin EC 81 MG tablet Take 1 tablet (81 mg total) by mouth daily.  Marland Kitchen atorvastatin (LIPITOR) 20 MG tablet Take 1 tablet (20 mg total) by mouth daily at 6 PM. Please make  yearly appt with Dr. Elease Hashimoto for January for future refills. 1st attempt  . metoprolol tartrate (LOPRESSOR) 25 MG tablet Take 1 tablet by mouth twice daily  . Multiple Vitamin (MULTIVITAMIN WITH MINERALS) TABS tablet Take 1 tablet by mouth daily.     Allergies:   Patient has no known allergies.   Social History   Socioeconomic History  . Marital status: Married    Spouse name: Not on file  . Number of children: Not on file  . Years of education: Not on file  . Highest education level: Not on file  Occupational History  . Not on file  Tobacco Use  . Smoking status: Never Smoker  . Smokeless tobacco: Never Used  Substance and Sexual Activity  . Alcohol use: Not on file    Comment: Per  son drinks nightly about of liquor, last drink last night 02/19/18  . Drug use: Never  . Sexual activity: Not on file  Other Topics Concern  . Not on file  Social History Narrative  . Not on file   Social Determinants of Health   Financial Resource Strain: Not on file  Food Insecurity: Not on file  Transportation Needs: Not on file  Physical Activity: Not on file  Stress: Not on file  Social Connections: Not on file     Family History: The patient's family history includes Hypertension in his father.  ROS:   Please see the history of present illness.     All other systems reviewed and are negative.  EKGs/Labs/Other Studies Reviewed:    The following studies were reviewed today:     Recent Labs: 11/02/2020: ALT 15; BUN 15; Creatinine, Ser 1.07; Potassium 4.1; Sodium 141  Recent Lipid Panel    Component Value Date/Time   CHOL 144 11/02/2020 0749   TRIG 138 11/02/2020 0749   HDL 48 11/02/2020 0749   CHOLHDL 3.0 11/02/2020 0749   CHOLHDL 3.6 02/20/2018 1046   VLDL 27 02/20/2018 1046   LDLCALC 72 11/02/2020 0749    Physical Exam: Blood pressure 128/80, pulse 66, height 5\' 10"  (1.778 m), SpO2 98 %.  GEN:  Well nourished, well developed in no acute distress HEENT: Normal NECK: No JVD; No carotid bruits LYMPHATICS: No lymphadenopathy CARDIAC: RRR,  Sternal insicion  - significant keloid formation on lower aspect of scar RESPIRATORY:  Clear to auscultation without rales, wheezing or rhonchi  ABDOMEN: Soft, non-tender, non-distended MUSCULOSKELETAL:  No edema; No deformity  SKIN: Warm and dry NEUROLOGIC:  Alert and oriented x 3   EKG:     November 02, 2020:  NSR at 13.  No ST or T wave changes   ASSESSMENT:    1. Coronary artery disease involving native coronary artery of native heart without angina pectoris    PLAN:    In order of problems listed above:  1. 1.  Coronary artery disease: no angina   2.  Postoperative atrial fibrillation:   Maintaining  NSR    3.   Hyperlipidemia Continue atorvastatin.  Check labs today     4.  HTN:  BP at home are all in the 1 40-1 50 range.  I initially wanted to start losartan but they are going out of town and will not be back for months.   Its clear he still eats a fairly salty diet.  Advised him to reduce his salt intake   Follow up with Dr. 71 in a year  Medication Adjustments/Labs and Tests Ordered: Current medicines are reviewed  at length with the patient today.  Concerns regarding medicines are outlined above.  Orders Placed This Encounter  Procedures  . EKG 12-Lead   No orders of the defined types were placed in this encounter.   Patient Instructions  Medication Instructions:  Your physician recommends that you continue on your current medications as directed. Please refer to the Current Medication list given to you today.  *If you need a refill on your cardiac medications before your next appointment, please call your pharmacy*   Lab Work: none If you have labs (blood work) drawn today and your tests are completely normal, you will receive your results only by: Marland Kitchen MyChart Message (if you have MyChart) OR . A paper copy in the mail If you have any lab test that is abnormal or we need to change your treatment, we will call you to review the results.   Testing/Procedures: none   Follow-Up: At [facility], you and your health needs are our priority.  As part of our continuing mission to provide you with exceptional heart care, we have created designated Provider Care Teams.  These Care Teams include your primary Cardiologist (physician) and Advanced Practice Providers (APPs -  Physician Assistants and Nurse Practitioners) who all work together to provide you with the care you need, when you need it.   Your next appointment:   1 year(s)  The format for your next appointment:   In Person  Provider:   Riley Lam, MD       Signed, Kristeen Miss, MD   11/02/2020 5:24 PM    [facility]

## 2020-11-02 ENCOUNTER — Other Ambulatory Visit: Payer: Medicaid Other | Admitting: *Deleted

## 2020-11-02 ENCOUNTER — Encounter: Payer: Self-pay | Admitting: Cardiovascular Disease

## 2020-11-02 ENCOUNTER — Other Ambulatory Visit: Payer: Self-pay

## 2020-11-02 ENCOUNTER — Ambulatory Visit (INDEPENDENT_AMBULATORY_CARE_PROVIDER_SITE_OTHER): Payer: Medicaid Other | Admitting: Cardiovascular Disease

## 2020-11-02 VITALS — BP 128/80 | HR 66 | Ht 70.0 in

## 2020-11-02 DIAGNOSIS — I10 Essential (primary) hypertension: Secondary | ICD-10-CM

## 2020-11-02 DIAGNOSIS — I2510 Atherosclerotic heart disease of native coronary artery without angina pectoris: Secondary | ICD-10-CM

## 2020-11-02 DIAGNOSIS — E782 Mixed hyperlipidemia: Secondary | ICD-10-CM

## 2020-11-02 LAB — LIPID PANEL
Chol/HDL Ratio: 3 ratio (ref 0.0–5.0)
Cholesterol, Total: 144 mg/dL (ref 100–199)
HDL: 48 mg/dL (ref >39)
LDL Chol Calc (NIH): 72 mg/dL (ref 0–99)
Triglycerides: 138 mg/dL (ref 0–149)
VLDL Cholesterol Cal: 24 mg/dL (ref 5–40)

## 2020-11-02 LAB — BASIC METABOLIC PANEL
BUN/Creatinine Ratio: 14 (ref 10–24)
BUN: 15 mg/dL (ref 8–27)
CO2: 23 mmol/L (ref 20–29)
Calcium: 9.1 mg/dL (ref 8.6–10.2)
Chloride: 102 mmol/L (ref 96–106)
Creatinine, Ser: 1.07 mg/dL (ref 0.76–1.27)
Glucose: 114 mg/dL — ABNORMAL HIGH (ref 65–99)
Potassium: 4.1 mmol/L (ref 3.5–5.2)
Sodium: 141 mmol/L (ref 134–144)
eGFR: 73 mL/min/{1.73_m2} (ref >59)

## 2020-11-02 LAB — ALT: ALT: 15 IU/L (ref 0–44)

## 2020-11-02 NOTE — Patient Instructions (Signed)
Medication Instructions:  Your physician recommends that you continue on your current medications as directed. Please refer to the Current Medication list given to you today.  *If you need a refill on your cardiac medications before your next appointment, please call your pharmacy*   Lab Work: none If you have labs (blood work) drawn today and your tests are completely normal, you will receive your results only by: Marland Kitchen MyChart Message (if you have MyChart) OR . A paper copy in the mail If you have any lab test that is abnormal or we need to change your treatment, we will call you to review the results.   Testing/Procedures: none   Follow-Up: At [facility], you and your health needs are our priority.  As part of our continuing mission to provide you with exceptional heart care, we have created designated Provider Care Teams.  These Care Teams include your primary Cardiologist (physician) and Advanced Practice Providers (APPs -  Physician Assistants and Nurse Practitioners) who all work together to provide you with the care you need, when you need it.   Your next appointment:   1 year(s)  The format for your next appointment:   In Person  Provider:   Riley Lam, MD

## 2022-09-16 NOTE — Progress Notes (Signed)
This encounter was created in error - please disregard.

## 2022-09-17 ENCOUNTER — Ambulatory Visit: Payer: Medicaid Other | Attending: Cardiovascular Disease | Admitting: Cardiovascular Disease

## 2023-04-30 ENCOUNTER — Encounter: Payer: Self-pay | Admitting: Cardiovascular Disease

## 2023-04-30 NOTE — Progress Notes (Addendum)
Cardiology Office Note:    Date:  05/01/2023   ID:  Shane Yates, DOB 1945-09-19, MRN [id number]  PCP:  Patient, No Pcp Per  Cardiologist:  Shane Miss, MD   Referring MD: No ref. provider found   Chief Complaint  Patient presents with   Coronary Artery Disease        Atrial Fibrillation        Hyperlipidemia    Prior notes:    Shane Yates is a 77 y.o. male with a hx of coronary artery disease-status post coronary artery bypass grafting. She is seen with family members who helped interpret.  No angina .  Has normal chest soreness.  Has some shortness of breath   Was having some oozing from his SVG harvest site  CXR shows small pleural effusions, left > right  Lasix has been continued.   April 15, 2018: Shane Yates is seen today for follow-up visit.  He has a history of coronary artery bypass grafting August, 2019.   He had postoperative atrial fibrillation and was started on amiodarone.  He saw Shane Yates, nurse practitioner last week for sinus tachycardia.  There is no evidence of recurrent atrial fibrillation. Amiodarone was continued at 200 mg a day.  Metoprolol was increased from 12.5 mg BID to 25 mg BID ( previous note by NP says it was increased to 50 mg BID but this is an error )   August 17, 2019:  Shane Yates is seen today in follow-up for his coronary artery disease and CABG in August, 2019.  He had some postoperative atrial fibrillation and was treated with amiodarone for several months. Seen with interprerter  - Shane Yates. Pt called son and had on speaker phone during visit    November 02, 2020:  Shane Yates is seen today for follow up of his CAD / CABG.  Post op Afib - was treated with amiodarone for several months . No CP ,    Has incisional pain  Walks quite a bit.   Hx of post op Afib.   , hx of HLD    Oct. 11, 2024 Shane Yates is seen for follow up of his CAD / CABG,  Shane Yates ( son ) acted as Marine scientist op Afib - treated with amio  for several months  No CP , no dyspnea  He walks regularly  Complains of painful keloid scar on his sternotomy      Past Medical History:  Diagnosis Date   Atrial fibrillation with RVR (HCC)    Chest pain    Coronary artery disease    Diabetes mellitus without complication (HCC)    EtOH dependence (HCC)    Hyperlipidemia    Hypertension    MI (myocardial infarction) (HCC)     Past Surgical History:  Procedure Laterality Date   APPENDECTOMY     CORONARY ARTERY BYPASS GRAFT N/A 02/26/2018   Procedure: CORONARY ARTERY BYPASS GRAFTING (CABG), ON PUMP, TIMES FOUR, USING LEFT INTERNAL MAMMARY ARTERY AND ENDOSCOPICALLY HARVESTED BILATERAL GREATER SAPHENOUS VEINS;  Surgeon: Shane Ovens, MD;  Location: [facility] OR;  Service: Open Heart Surgery;  Laterality: N/A;  Right SVG SEQ to Distal Circumflex and Ramus, Left SVG to PDA, LIMA to LAD   LEFT HEART CATH AND CORONARY ANGIOGRAPHY N/A 02/23/2018   Procedure: LEFT HEART CATH AND CORONARY ANGIOGRAPHY;  Surgeon: Corky Crafts, MD;  Location: [facility] INVASIVE CV LAB;  Service: Cardiovascular;  Laterality: N/A;   TEE WITHOUT CARDIOVERSION N/A 02/26/2018  Procedure: TRANSESOPHAGEAL ECHOCARDIOGRAM (TEE);  Surgeon: Shane Ovens, MD;  Location: [facility] OR;  Service: Open Heart Surgery;  Laterality: N/A;    Current Medications: Current Meds  Medication Sig   aspirin EC 81 MG tablet Take 1 tablet (81 mg total) by mouth daily.   atorvastatin (LIPITOR) 20 MG tablet Take 1 tablet (20 mg total) by mouth daily at 6 PM. Please make yearly appt with Dr. Elease Hashimoto for January for future refills. 1st attempt   metoprolol tartrate (LOPRESSOR) 25 MG tablet Take 1 tablet by mouth twice daily   Multiple Vitamin (MULTIVITAMIN WITH MINERALS) TABS tablet Take 1 tablet by mouth daily.     Allergies:   Patient has no known allergies.   Social History   Socioeconomic History   Marital status: Married    Spouse name: Not on file   Number of children: Not on file    Years of education: Not on file   Highest education level: Not on file  Occupational History   Not on file  Tobacco Use   Smoking status: Never   Smokeless tobacco: Never  Substance and Sexual Activity   Alcohol use: Not on file    Comment: Per son drinks nightly about of liquor, last drink last night 02/19/18   Drug use: Never   Sexual activity: Not on file  Other Topics Concern   Not on file  Social History Narrative   Not on file   Social Determinants of Health   Financial Resource Strain: Not on file  Food Insecurity: Not on file  Transportation Needs: Not on file  Physical Activity: Not on file  Stress: Not on file  Social Connections: Not on file     Family History: The patient's family history includes Hypertension in his father.  ROS:   Please see the history of present illness.     All other systems reviewed and are negative.  EKGs/Labs/Other Studies Reviewed:    The following studies were reviewed today:     Recent Labs: No results found for requested labs within last 365 days.  Recent Lipid Panel    Component Value Date/Time   CHOL 144 11/02/2020 0749   TRIG 138 11/02/2020 0749   HDL 48 11/02/2020 0749   CHOLHDL 3.0 11/02/2020 0749   CHOLHDL 3.6 02/20/2018 1046   VLDL 27 02/20/2018 1046   LDLCALC 72 11/02/2020 0749    Physical Exam: Blood pressure 130/78, pulse 70, height 5\' 11"  (1.803 m), weight 164 lb 9.6 oz (74.7 kg), SpO2 95%.  GEN:  Well nourished, well developed in no acute distress HEENT: Normal NECK: No JVD; No carotid bruits LYMPHATICS: No lymphadenopathy CARDIAC: RRR,  Sternal insicion  - significant keloid formation on lower aspect of scar RESPIRATORY:  Clear to auscultation without rales, wheezing or rhonchi  ABDOMEN: Soft, non-tender, non-distended MUSCULOSKELETAL:  No edema; No deformity  SKIN: Warm and dry NEUROLOGIC:  Alert and oriented x 3   EKG:      EKG Interpretation Date/Time:  Friday May 01 2023 11:32:58  EDT Ventricular Rate:  66 PR Interval:  146 QRS Duration:  86 QT Interval:  412 QTC Calculation: 431 R Axis:   27  Text Interpretation: Normal sinus rhythm T wave abnormality, consider lateral ischemia When compared with ECG of 08-Mar-2018 20:33, No significant change since last tracing Confirmed by Shane Yates ([id number]) on 05/01/2023 1:22:25 PM     ASSESSMENT:    1. Coronary artery disease involving native coronary artery of native heart  without angina pectoris     PLAN:       1.  Coronary artery disease no angina ,   2.  Postoperative atrial fibrillation:     remains in NSR .     3.   Hyperlipidemia :   check lipids today      4.  HTN:   BP is fairly well controlled.   Son states that he drinks a fair amount of alcohol ( 150 cc of scotch a day   I've suggested that he should probably limit that     Medication Adjustments/Labs and Tests Ordered: Current medicines are reviewed at length with the patient today.  Concerns regarding medicines are outlined above.  Orders Placed This Encounter  Procedures   ALT   Basic metabolic panel   Lipid panel   EKG 12-Lead   No orders of the defined types were placed in this encounter.   Patient Instructions  Medication Instructions:  Your physician recommends that you continue on your current medications as directed. Please refer to the Current Medication list given to you today.  *If you need a refill on your cardiac medications before your next appointment, please call your pharmacy*  Lab Work: TODAY: Lipid panel, ALT, BMP If you have labs (blood work) drawn today and your tests are completely normal, you will receive your results only by: MyChart Message (if you have MyChart) OR A paper copy in the mail If you have any lab test that is abnormal or we need to change your treatment, we will call you to review the results.  Testing/Procedures: None ordered today.  Follow-Up: At [facility], you and your health  needs are our priority.  As part of our continuing mission to provide you with exceptional heart care, we have created designated Provider Care Teams.  These Care Teams include your primary Cardiologist (physician) and Advanced Practice Providers (APPs -  Physician Assistants and Nurse Practitioners) who all work together to provide you with the care you need, when you need it.  Your next appointment:   1 year(s)  The format for your next appointment:   In Person  Provider:   Kristeen Miss, MD  or Jari Favre, PA-C, Ronie Spies, PA-C, Robin Searing, NP, Eligha Bridegroom, NP, Tereso Newcomer, PA-C, or Perlie Gold, PA-C    Signed, Shane Miss, MD  05/01/2023 1:22 PM    [facility]

## 2023-05-01 ENCOUNTER — Ambulatory Visit: Payer: Medicaid Other | Attending: Cardiovascular Disease | Admitting: Cardiovascular Disease

## 2023-05-01 ENCOUNTER — Encounter: Payer: Self-pay | Admitting: Cardiovascular Disease

## 2023-05-01 VITALS — BP 130/78 | HR 70 | Ht 71.0 in | Wt 164.6 lb

## 2023-05-01 DIAGNOSIS — I2510 Atherosclerotic heart disease of native coronary artery without angina pectoris: Secondary | ICD-10-CM | POA: Diagnosis not present

## 2023-05-01 NOTE — Patient Instructions (Signed)
Medication Instructions:  Your physician recommends that you continue on your current medications as directed. Please refer to the Current Medication list given to you today.  *If you need a refill on your cardiac medications before your next appointment, please call your pharmacy*  Lab Work: TODAY: Lipid panel, ALT, BMP If you have labs (blood work) drawn today and your tests are completely normal, you will receive your results only by: MyChart Message (if you have MyChart) OR A paper copy in the mail If you have any lab test that is abnormal or we need to change your treatment, we will call you to review the results.  Testing/Procedures: None ordered today.  Follow-Up: At [facility], you and your health needs are our priority.  As part of our continuing mission to provide you with exceptional heart care, we have created designated Provider Care Teams.  These Care Teams include your primary Cardiologist (physician) and Advanced Practice Providers (APPs -  Physician Assistants and Nurse Practitioners) who all work together to provide you with the care you need, when you need it.  Your next appointment:   1 year(s)  The format for your next appointment:   In Person  Provider:   Kristeen Miss, MD  or Jari Favre, PA-C, Ronie Spies, PA-C, Robin Searing, NP, Eligha Bridegroom, NP, Tereso Newcomer, PA-C, or Perlie Gold, PA-C

## 2023-05-02 LAB — LIPID PANEL
Chol/HDL Ratio: 2.7 {ratio} (ref 0.0–5.0)
Cholesterol, Total: 172 mg/dL (ref 100–199)
HDL: 64 mg/dL (ref >39)
LDL Chol Calc (NIH): 92 mg/dL (ref 0–99)
Triglycerides: 90 mg/dL (ref 0–149)
VLDL Cholesterol Cal: 16 mg/dL (ref 5–40)

## 2023-05-02 LAB — BASIC METABOLIC PANEL
BUN/Creatinine Ratio: 16 (ref 10–24)
BUN: 18 mg/dL (ref 8–27)
CO2: 24 mmol/L (ref 20–29)
Calcium: 9.1 mg/dL (ref 8.6–10.2)
Chloride: 102 mmol/L (ref 96–106)
Creatinine, Ser: 1.1 mg/dL (ref 0.76–1.27)
Glucose: 85 mg/dL (ref 70–99)
Potassium: 4 mmol/L (ref 3.5–5.2)
Sodium: 142 mmol/L (ref 134–144)
eGFR: 70 mL/min/{1.73_m2} (ref >59)

## 2023-05-02 LAB — ALT: ALT: 17 [IU]/L (ref 0–44)

## 2023-05-08 ENCOUNTER — Telehealth: Payer: Self-pay

## 2023-05-08 DIAGNOSIS — Z79899 Other long term (current) drug therapy: Secondary | ICD-10-CM

## 2023-05-08 DIAGNOSIS — E782 Mixed hyperlipidemia: Secondary | ICD-10-CM

## 2023-05-08 MED ORDER — EZETIMIBE 10 MG PO TABS: 10.0000 mg | ORAL_TABLET | Freq: Every day | ORAL | 3 refills | Status: DC

## 2023-05-08 MED ORDER — ATORVASTATIN CALCIUM 40 MG PO TABS: 40.0000 mg | ORAL_TABLET | Freq: Every day | ORAL | 3 refills | Status: AC

## 2023-05-08 NOTE — Telephone Encounter (Signed)
Called patient via [employer] interpretor ID [phone]. Spoke with son on DPR who spoke on behalf of father. Verbalized understanding of need to increase Lipitor and start Zetia. Asked that we send to [facility]. Medications sent to pharmacy at this time. Labs entered and scheduled for 08/06/22.

## 2023-05-08 NOTE — Telephone Encounter (Signed)
-----   Message from Kristeen Miss sent at 05/04/2023  7:11 AM EDT ----- ALT is normal BMP is stable LDL is 92.  His goal is < 70 Increase atorvastatin to 40 mg a day  Add zetia 10 mg a day  Repeat lipids and ALT in 3 months

## 2023-08-07 ENCOUNTER — Other Ambulatory Visit: Payer: Medicaid Other

## 2023-08-07 DIAGNOSIS — Z79899 Other long term (current) drug therapy: Secondary | ICD-10-CM

## 2024-01-29 ENCOUNTER — Telehealth: Payer: Self-pay | Admitting: Nurse Practitioner

## 2024-01-29 MED ORDER — METOPROLOL TARTRATE 25 MG PO TABS: 25.0000 mg | ORAL_TABLET | Freq: Two times a day (BID) | ORAL | 0 refills | Status: DC

## 2024-01-29 MED ORDER — EZETIMIBE 10 MG PO TABS: 10.0000 mg | ORAL_TABLET | Freq: Every day | ORAL | 0 refills | Status: DC

## 2024-01-29 NOTE — Telephone Encounter (Signed)
*  STAT* If patient is at the pharmacy, call can be transferred to refill team.   1. Which medications need to be refilled? (please list name of each medication and dose if known)  metoprolol  tartrate (LOPRESSOR ) 25 MG tablet  ezetimibe  (ZETIA ) 10 MG tablet  2. Which pharmacy/location (including street and city if local pharmacy) is medication to be sent to? [facility] [address] [place], [place] - [address] Phone: [phone]  Fax: [phone]     3. Do they need a 30 day or 90 day supply? 90

## 2024-01-29 NOTE — Telephone Encounter (Signed)
 Refills has been sent to the pharmacy.

## 2024-05-06 ENCOUNTER — Ambulatory Visit: Admitting: Physician Assistant

## 2024-05-06 ENCOUNTER — Ambulatory Visit: Admitting: Nurse Practitioner

## 2024-06-01 ENCOUNTER — Other Ambulatory Visit: Payer: Self-pay | Admitting: Nurse Practitioner

## 2024-06-02 NOTE — Progress Notes (Deleted)
    Cardiology Office Note Date:  06/02/2024  ID:  Shane Yates, DOB 11-May-1946, MRN [id number] PCP:  Patient, No Pcp Per  Cardiologist: Joelle VEAR Ren Donley, MD  No chief complaint on file.     Problems CAD s/p CABG x 4 (LIMA-LAD, SVG-PDA, SVG to distal Lcx and Ramus)02/2018 TTE 02/2018: 40-45%, mildly dilated aortic root LDL 92 10/24 Post op Afib HTN/HLD M: ASA81, AN40, EE10, MT25BID  Visits  11/25:    History of Present Illness: Shane Yates is a 78 y.o. male who presents for follow up.   ROS: Please see the history of present illness. All other systems are reviewed and negative.   PHYSICAL EXAM: VS:  There were no vitals taken for this visit. , BMI There is no height or weight on file to calculate BMI. GEN: Well nourished, well developed, in no acute distress HEENT: normal Neck: no JVD, carotid bruits, or masses Cardiac: ***RRR; no murmurs, rubs, or gallops,no edema  Respiratory:  CTAB bilaterally, normal work of breathing GI: soft, nontender, nondistended, + BS Extremities: No LE edema Skin: warm and dry, no rash Neuro:  Strength and sensation are intact  EKG: ***  Recent Labs: Reviewed  Studies: Reviewed  ASSESSMENT AND PLAN: Shane Yates is a 78 y.o. male who presents for new visit.  - *** - *** - *** - ***   Signed, Joelle VEAR Ren Donley, MD  06/02/2024 9:46 AM    [facility]

## 2024-06-03 ENCOUNTER — Ambulatory Visit

## 2024-06-03 DIAGNOSIS — E782 Mixed hyperlipidemia: Secondary | ICD-10-CM

## 2024-06-03 DIAGNOSIS — Z951 Presence of aortocoronary bypass graft: Secondary | ICD-10-CM

## 2024-06-03 DIAGNOSIS — I10 Essential (primary) hypertension: Secondary | ICD-10-CM

## 2024-06-03 DIAGNOSIS — I4891 Unspecified atrial fibrillation: Secondary | ICD-10-CM

## 2024-06-22 ENCOUNTER — Other Ambulatory Visit: Payer: Self-pay | Admitting: Nurse Practitioner
